# Patient Record
Sex: Female | Born: 1985 | Race: Black or African American | Hispanic: No | Marital: Married | Smoking: Never smoker
Health system: Southern US, Community
[De-identification: ages and names within clinical notes are randomized; demographics above are authoritative.]

## PROBLEM LIST (undated history)

## (undated) DIAGNOSIS — D649 Anemia, unspecified: Secondary | ICD-10-CM

## (undated) DIAGNOSIS — L564 Polymorphous light eruption: Secondary | ICD-10-CM

## (undated) DIAGNOSIS — O99210 Obesity complicating pregnancy, unspecified trimester: Secondary | ICD-10-CM

## (undated) DIAGNOSIS — Z789 Other specified health status: Secondary | ICD-10-CM

## (undated) HISTORY — PX: NO PAST SURGERIES: SHX2092

## (undated) HISTORY — PX: OTHER SURGICAL HISTORY: SHX169

---

## 2011-04-03 ENCOUNTER — Encounter: Payer: Self-pay | Admitting: Emergency Medicine

## 2011-04-03 ENCOUNTER — Emergency Department (HOSPITAL_COMMUNITY)
Admission: EM | Admit: 2011-04-03 | Discharge: 2011-04-03 | Disposition: A | Discharge status: Home / Self Care | Payer: BC Managed Care – PPO | Attending: Emergency Medicine | Admitting: Emergency Medicine

## 2011-04-03 DIAGNOSIS — L02211 Cutaneous abscess of abdominal wall: Secondary | ICD-10-CM

## 2011-04-03 DIAGNOSIS — L02219 Cutaneous abscess of trunk, unspecified: Secondary | ICD-10-CM | POA: Insufficient documentation

## 2011-04-03 MED ORDER — DOXYCYCLINE HYCLATE 100 MG PO TABS
100.0000 mg | ORAL_TABLET | Freq: Once | ORAL | Status: AC
  Administered 2011-04-03: 100 mg via ORAL
  Filled 2011-04-03: qty 1

## 2011-04-03 MED ORDER — DOXYCYCLINE HYCLATE 100 MG PO CAPS: 100.0000 mg | ORAL_CAPSULE | Freq: Two times a day (BID) | ORAL | Status: AC

## 2011-04-03 NOTE — ED Notes (Signed)
Has an abcess on her abd that started 4 or 5 days ago. Started draining 2 days ago

## 2011-04-03 NOTE — Discharge Instructions (Signed)

## 2011-04-03 NOTE — ED Provider Notes (Signed)
History     CSN: [id number] Arrival date & time: 04/03/2011  8:36 PM   First MD Initiated Contact with Patient 04/03/11 2104      Chief Complaint  Patient presents with  . Recurrent Skin Infections    Abcess started x 4 to 5 days ago    (Consider location/radiation/quality/duration/timing/severity/associated sxs/prior treatment) Patient is a 25 y.o. female presenting with abscess. The history is provided by the patient.  Abscess  This is a recurrent problem. The current episode started less than one week ago. The onset was gradual. The problem occurs frequently. The problem has been gradually worsening. The abscess is present on the abdomen. The problem is moderate. The abscess is characterized by redness, painfulness, burning and draining. It is unknown (had a friend with MRSA) what she was exposed to. The abscess first occurred at home. Pertinent negatives include no fever, no vomiting and no cough. There were no sick contacts. Recently, medical care has been given at this facility.    History reviewed. No pertinent past medical history.  History reviewed. No pertinent past surgical history.  No family history on file.  History  Substance Use Topics  . Smoking status: Never Smoker   . Smokeless tobacco: Never Used  . Alcohol Use: Yes    OB History    Grav Para Term Preterm Abortions TAB SAB Ect Mult Living                  Review of Systems  Constitutional: Negative for fever.  Respiratory: Negative for cough.   Gastrointestinal: Negative for vomiting.  All other systems reviewed and are negative.    Allergies  Review of patient's allergies indicates no known allergies.  Home Medications   Current Outpatient Rx  Name Route Sig Dispense Refill  . CHLORHEXIDINE GLUCONATE 4 % EX LIQD Topical Apply 60 mLs topically daily.      Marland Kitchen MUPIROCIN CALCIUM 2 % EX CREA Topical Apply 1 application topically 3 (three) times daily. APPLIES TO NOSTRILS     . TRAMADOL HCL 50 MG  PO TABS Oral Take 50-100 mg by mouth every 8 (eight) hours as needed. PAIN.Maximum dose= 8 tablets per day       BP 124/63  Pulse 95  Temp(Src) 99.3 F (37.4 C) (Oral)  Resp 20  SpO2 100%  LMP 03/31/2011  Physical Exam  Nursing note and vitals reviewed. Constitutional: She is oriented to person, place, and time. She appears well-developed and well-nourished. No distress.  HENT:  Head: Normocephalic and atraumatic.  Eyes: Conjunctivae and EOM are normal. Pupils are equal, round, and reactive to light.  Neck: Normal range of motion. Neck supple.  Abdominal: Soft. Bowel sounds are normal.    Musculoskeletal: Normal range of motion. She exhibits no tenderness.  Neurological: She is alert and oriented to person, place, and time.  Skin: Skin is warm and dry.  Psychiatric: She has a normal mood and affect. Her behavior is normal.    ED Course  Procedures (including critical care time)  Labs Reviewed - No data to display No results found.  INCISION AND DRAINAGE Performed by: Gwyneth Sprout Consent: Verbal consent obtained. Risks and benefits: risks, benefits and alternatives were discussed Type: abscess  Body area: abdomen  Anesthesia: local infiltration  Local anesthetic: lidocaine 1% with epinephrine  Anesthetic total: 8 ml  Complexity: complex Blunt dissection to break up loculations  Drainage: purulent  Drainage amount: 15mL  Packing material: 1/4 in iodoform gauze  Patient tolerance: Patient tolerated  the procedure well with no immediate complications.     No diagnosis found.    MDM   Pt with abscess on the abd which is recurrent.  This is now her third abscess.  Mild surrounding erythema.  Will I&D and start on doxy.  Will send a culture for further eval.  Pt is going to f/u with ID.       Gwyneth Sprout, MD 04/03/11 2151

## 2011-04-05 ENCOUNTER — Encounter (HOSPITAL_COMMUNITY): Payer: Self-pay | Admitting: *Deleted

## 2011-04-05 ENCOUNTER — Emergency Department (HOSPITAL_COMMUNITY)
Admission: EM | Admit: 2011-04-05 | Discharge: 2011-04-05 | Disposition: A | Discharge status: Home / Self Care | Payer: BC Managed Care – PPO | Attending: Emergency Medicine | Admitting: Emergency Medicine

## 2011-04-05 DIAGNOSIS — L0291 Cutaneous abscess, unspecified: Secondary | ICD-10-CM | POA: Insufficient documentation

## 2011-04-05 DIAGNOSIS — Z09 Encounter for follow-up examination after completed treatment for conditions other than malignant neoplasm: Secondary | ICD-10-CM | POA: Insufficient documentation

## 2011-04-05 NOTE — ED Provider Notes (Signed)
Medical screening examination/treatment/procedure(s) were performed by non-physician practitioner and as supervising physician I was immediately available for consultation/collaboration.  Nicholes Stairs, MD 04/05/11 [phone]

## 2011-04-05 NOTE — ED Notes (Signed)
Patient was seen in the ER on Friday.  Pt has history of MRSA x1 month ago. Pt has had abscesses to leg and arm. Pt noted abscess to right mid abdomen. Pt was given an antibiotic and had wound drained and packed. Pt was seen by MD Plunkett. Pt is here for a wound check and packing removal.  Pt reports some pain and reports yellow drainage and blood from wound. Pt denies fevers or red streaking from the wound.

## 2011-04-05 NOTE — ED Provider Notes (Signed)
History     CSN: [id number] Arrival date & time: 04/05/2011  1:11 PM   First MD Initiated Contact with Patient 04/05/11 1551      Chief Complaint  Patient presents with  . Wound Check    and packing removal      Patient is a 25 y.o. female presenting with wound check. The history is provided by the patient.  Wound Check  She was treated in the ED 2 to 3 days ago. Previous treatment in the ED includes I&D of abscess. Treatments since wound repair include oral antibiotics and soaks. There has been colored discharge from the wound. The redness has improved. There is no swelling present. The pain has not changed.  Pt had I&D to abscess to (R) lower abd 04/03/2011. Here for wound recheck.   History reviewed. No pertinent past medical history.  History reviewed. No pertinent past surgical history.  History reviewed. No pertinent family history.  History  Substance Use Topics  . Smoking status: Never Smoker   . Smokeless tobacco: Never Used  . Alcohol Use: Yes    OB History    Grav Para Term Preterm Abortions TAB SAB Ect Mult Living                  Review of Systems  Allergies  Review of patient's allergies indicates no known allergies.  Home Medications   Current Outpatient Rx  Name Route Sig Dispense Refill  . CHLORHEXIDINE GLUCONATE 4 % EX LIQD Topical Apply 60 mLs topically daily.      Marland Kitchen DOXYCYCLINE HYCLATE 100 MG PO CAPS Oral Take 1 capsule (100 mg total) by mouth 2 (two) times daily. 20 capsule 0  . MUPIROCIN CALCIUM 2 % EX CREA Topical Apply 1 application topically 3 (three) times daily. APPLIES TO NOSTRILS     . TRAMADOL HCL 50 MG PO TABS Oral Take 50-100 mg by mouth every 8 (eight) hours as needed. PAIN.Maximum dose= 8 tablets per day       BP 118/67  Pulse 74  Temp(Src) 98.6 F (37 C) (Oral)  Resp 18  SpO2 100%  LMP 03/31/2011  Physical Exam  ED Course  Procedures Packing removed. Wound appears to be improving though pt c/o persistent TTP. Will  encourage continued warm soaks and completion of abx.  Labs Reviewed - No data to display No results found.   No diagnosis found.    MDM  Improving abscess to (R) lower abd.        Leanne Chang, NP 04/05/11 1630

## 2011-04-06 LAB — WOUND CULTURE

## 2011-04-07 ENCOUNTER — Encounter: Payer: Self-pay | Admitting: Internal Medicine

## 2011-04-07 ENCOUNTER — Encounter: Payer: Self-pay | Admitting: Licensed Clinical Social Worker

## 2011-04-07 ENCOUNTER — Ambulatory Visit (INDEPENDENT_AMBULATORY_CARE_PROVIDER_SITE_OTHER): Payer: BC Managed Care – PPO | Admitting: Internal Medicine

## 2011-04-07 VITALS — BP 118/84 | HR 79 | Temp 98.4°F | Ht 67.0 in | Wt 234.0 lb

## 2011-04-07 DIAGNOSIS — A4902 Methicillin resistant Staphylococcus aureus infection, unspecified site: Secondary | ICD-10-CM

## 2011-04-07 MED ORDER — SULFAMETHOXAZOLE-TRIMETHOPRIM 400-80 MG PO TABS: 1.0000 | ORAL_TABLET | Freq: Two times a day (BID) | ORAL | Status: AC

## 2011-04-07 MED ORDER — RIFAMPIN 300 MG PO CAPS: 300.0000 mg | ORAL_CAPSULE | Freq: Two times a day (BID) | ORAL | Status: AC

## 2011-04-07 MED ORDER — CHLORHEXIDINE GLUCONATE 4 % EX LIQD: 60.0000 mL | Freq: Every day | CUTANEOUS | Status: DC

## 2011-04-07 NOTE — ED Notes (Signed)
Patient f/u with ID.

## 2011-04-08 DIAGNOSIS — A4902 Methicillin resistant Staphylococcus aureus infection, unspecified site: Secondary | ICD-10-CM | POA: Insufficient documentation

## 2011-04-08 NOTE — Progress Notes (Signed)
INFECTIOUS DISEASES INITIAL VISIT  RFV: recurrent MRSA skin infections  Subjective:    Patient ID: Regina Terrell, female    DOB: 1985/12/22, 25 y.o.   MRN: [id number]  HPI 25yo Female with no significant past medical history reports having new onset of skin abscesses since end of October 2012. She first noted to have a right knee lesion measuring 2.5cm and R arm lesion 1.5cm; she underwent I X D of knee lesion by PCP with cultures identifying MRSA ( vanco 1, tmp/smx <0.5, tetracycline <4, clinda S, erythro R). She received a course of bactrim which improved her lesion. The lesion on her R arm self draining. She now presents with a 7 day history of a lesion appearing on the right side of abdomen. It became increasingly tender and indurated, and thus she went to ED 4 days ago to be evaluated. She underwent her 2nd I X D and the incision packed for 2 days. She was given a 10 day course of doxycycline for infection and tramadol for pain. She is on day #4 of abtx. She states it is still somewhat tender, and her lesion is still draining fluid but less than before.  She denies fever, chills, nightsweats, no other skin lesions.  Current Outpatient Prescriptions  Medication Sig Dispense Refill  . doxycycline (VIBRAMYCIN) 100 MG capsule Take 1 capsule (100 mg total) by mouth 2 (two) times daily.  20 capsule  0  . mupirocin cream (BACTROBAN) 2 % Apply 1 application topically 3 (three) times daily. APPLIES TO NOSTRILS       . traMADol (ULTRAM) 50 MG tablet Take 50-100 mg by mouth every 8 (eight) hours as needed. PAIN.Maximum dose= 8 tablets per day       . chlorhexidine (HIBICLENS) 4 % external liquid Apply 60 mLs (4 application total) topically daily. Wash daily , head to toe, x 14 days  473 mL  1  . rifampin (RIFADIN) 300 MG capsule Take 1 capsule (300 mg total) by mouth 2 (two) times daily.  28 capsule  0  . sulfamethoxazole-trimethoprim (BACTRIM) 400-80 MG per tablet Take 1 tablet by mouth 2 (two)  times daily.  28 tablet  0   No Known Allergies  Past med hx: MRSA skin infections  SHx: patient lives in [place], goes to [employer], she works part-time for [employer]. No smoking. No drinking. No illicit drugs. Has a small dog that now resides with her mother.  FHX: HTN, DM  Review of Systems Constitutional: Negative for fever, chills, diaphoresis, activity change, appetite change, fatigue and unexpected weight change.  HENT: Negative for congestion, sore throat, rhinorrhea, sneezing, trouble swallowing and sinus pressure.  Eyes: Negative for photophobia and visual disturbance.  Respiratory: Negative for cough, chest tightness, shortness of breath, wheezing and stridor.  Cardiovascular: Negative for chest pain, palpitations and leg swelling.  Gastrointestinal: Negative for nausea, vomiting, abdominal pain, diarrhea, constipation, blood in stool, abdominal distention and anal bleeding.  Genitourinary: Negative for dysuria, hematuria, flank pain and difficulty urinating.  Musculoskeletal: Negative for myalgias, back pain, joint swelling, arthralgias and gait problem.  Skin: wound on her abdomen from recent I X D, painful and still weeping.  Neurological: Negative for dizziness, tremors, weakness and light-headedness.  Hematological: Negative for adenopathy. Does not bruise/bleed easily.  Psychiatric/Behavioral: Negative for behavioral problems, confusion, sleep disturbance, dysphoric mood, decreased concentration and agitation.       Objective:   Physical Exam BP 118/84  Pulse 79  Temp(Src) 98.4 F (36.9 C) (Oral)  Ht 5\' 7"  (1.702 m)  Wt 234 lb (106.142 kg)  BMI 36.65 kg/m2  LMP 03/31/2011  General Appearance:    Alert, cooperative, no distress, appears stated age  Head:    Normocephalic, without obvious abnormality, atraumatic  Eyes:    PERRL, conjunctiva/corneas clear, EOM's intact, fundi    benign, both eyes  Ears:    Normal TM's and external ear canals, both ears    Nose:   Nares normal, septum midline, mucosa normal, no drainage    or sinus tenderness  Throat:   Lips, mucosa, and tongue normal; teeth and gums normal  Neck:   Supple, symmetrical, trachea midline, no adenopathy;    thyroid:  no enlargement/tenderness/nodules; no carotid   bruit or JVD     Lungs:     Clear to auscultation bilaterally, respirations unlabored      Heart:    Regular rate and rhythm, S1 and S2 normal, no murmur, rub   or gallop     Abdomen:     Soft, non-tender, bowel sounds active all four quadrants,    no masses, no organomegaly        Extremities:   Extremities normal, atraumatic, no cyanosis or edema  Pulses:   2+ and symmetric all extremities  Skin:   Small, 1.5cm incision in between RUQ/RLQ from I X D of skin abscess 2-3 mm deep, still has purulent exudate. Indurated 4 cm. Not warm, no fluctuance  Lymph nodes:   Cervical, supraclavicular, and axillary nodes normal      Old micro: 03/02/11: right knee cx. Rare GPC and WBC. + MRSA(  vanco 1, tmp/smx <0.5, tetracycline <4, clinda S, erythro R)    Assessment & Plan:  MRSA Skin infection = continue with doxycycline until she finishes out 10 day course of therapy. Will clean and pack with wick at today's clinic, and have the patient come back in 2 days to see how it is healing.  MRSA decolonization = after she finished doxycycline, will do  2wks of bactrim plus rifampin, hibiclens body wash, and mupirocin nasal swab TID.

## 2011-04-09 ENCOUNTER — Ambulatory Visit: Payer: BC Managed Care – PPO | Admitting: *Deleted

## 2011-04-09 DIAGNOSIS — A4902 Methicillin resistant Staphylococcus aureus infection, unspecified site: Secondary | ICD-10-CM

## 2011-04-13 ENCOUNTER — Ambulatory Visit: Payer: BC Managed Care – PPO

## 2011-04-14 ENCOUNTER — Encounter: Payer: Self-pay | Admitting: *Deleted

## 2011-04-14 ENCOUNTER — Ambulatory Visit (INDEPENDENT_AMBULATORY_CARE_PROVIDER_SITE_OTHER): Payer: BC Managed Care – PPO | Admitting: Internal Medicine

## 2011-04-14 ENCOUNTER — Encounter: Payer: Self-pay | Admitting: Internal Medicine

## 2011-04-14 VITALS — BP 122/80 | HR 72 | Temp 98.5°F | Wt 232.0 lb

## 2011-04-14 DIAGNOSIS — L0390 Cellulitis, unspecified: Secondary | ICD-10-CM

## 2011-04-14 DIAGNOSIS — L0291 Cutaneous abscess, unspecified: Secondary | ICD-10-CM

## 2011-04-14 NOTE — Progress Notes (Signed)
INFECTIOUS DISEASES FOLLOW-UP Subjective:    Patient ID: Regina Terrell, female    DOB: [birth year], 25 y.o.   MRN: [id number]  HPI  HPI 25yo Female with history of MRSA skin abscess since end of October 2012. Last seen in ID clinic on 12/07 for 1 wk hx of abdominal SSTI/abscess that became increasingly tender and indurated, and went to ED for 2nd I X D . She just finished a 10 day course of doxycycline for infection and tramadol for pain. She was started on MRSA decolonization protocol with bactrim, rifampin, mupiricin nasal ointment and chlorohexedine body wash. Her lesion is slowly improving. No longer draining. Less indurated, getting smaller in size.  She denies fever, chills, nightsweats, no other skin lesions.  Abtx: Bactrim DS 1 tab BID Rifampin 300mg  BID Mupirocin ointment TID chlorohexidine body wash daily   Review of Systems Review of Systems  Constitutional: Negative for fever, chills, diaphoresis, activity change, appetite change, fatigue and unexpected weight change.  HENT: Negative for congestion, sore throat, rhinorrhea, sneezing, trouble swallowing and sinus pressure.  Eyes: Negative for photophobia and visual disturbance.  Respiratory: Negative for cough, chest tightness, shortness of breath, wheezing and stridor.  Cardiovascular: Negative for chest pain, palpitations and leg swelling.  Gastrointestinal: Negative for nausea, vomiting, abdominal pain, diarrhea, constipation, blood in stool, abdominal distention and anal bleeding.  Genitourinary: Negative for dysuria, hematuria, flank pain and difficulty urinating.  Musculoskeletal: Negative for myalgias, back pain, joint swelling, arthralgias and gait problem.  Skin: slowly healing abdominal wound, mildly tender Neurological: Negative for dizziness, tremors, weakness and light-headedness.  Hematological: Negative for adenopathy. Does not bruise/bleed easily.  Psychiatric/Behavioral: Negative for behavioral  problems, confusion, sleep disturbance, dysphoric mood, decreased concentration and agitation.       Objective:   Physical Exam Skin exam: 9 x 3 cm hyperpigmented elliptical lesion with a small<37mm incision left of center. Indurated area measures 6 x 2cm  Labs:  12/7: abdominal wound MSSA     Assessment & Plan:  24yo F with known MRSA skin infections finished course of doxycycline for MSSA abscess s/p I X D.  - MSSA abdominal abscess s/p I X D = looking improved - MRSA decolonization = continue with current regimen of bactrim, rifampin, mupirocin, and chlorohexedine body wash - RTC in 2 wks to see how wound is doing.

## 2011-04-15 ENCOUNTER — Telehealth: Payer: Self-pay | Admitting: *Deleted

## 2011-04-15 ENCOUNTER — Ambulatory Visit: Payer: BC Managed Care – PPO | Admitting: Internal Medicine

## 2011-04-15 NOTE — Telephone Encounter (Signed)
Pt reports "fever, feeling hot" this AM.  Requesting not to return to work today.  Previously no reported "fever."  Pt given return appt w/ Dr. Drue Second for evaluation.

## 2011-04-16 ENCOUNTER — Ambulatory Visit (INDEPENDENT_AMBULATORY_CARE_PROVIDER_SITE_OTHER): Payer: BC Managed Care – PPO | Admitting: Internal Medicine

## 2011-04-16 ENCOUNTER — Encounter: Payer: Self-pay | Admitting: Internal Medicine

## 2011-04-16 VITALS — BP 125/78 | HR 76 | Temp 98.3°F | Wt 234.0 lb

## 2011-04-16 DIAGNOSIS — B349 Viral infection, unspecified: Secondary | ICD-10-CM

## 2011-04-16 DIAGNOSIS — B9789 Other viral agents as the cause of diseases classified elsewhere: Secondary | ICD-10-CM

## 2011-04-16 LAB — CBC WITH DIFFERENTIAL/PLATELET
Hemoglobin: 12.3 g/dL (ref 12.0–15.0)
Lymphs Abs: 1.4 10*3/uL (ref 0.7–4.0)
Monocytes Relative: 9 % (ref 3–12)
Neutro Abs: 1.3 10*3/uL — ABNORMAL LOW (ref 1.7–7.7)
Neutrophils Relative %: 41 % — ABNORMAL LOW (ref 43–77)
Platelets: 243 10*3/uL (ref 150–400)
RBC: 4.13 MIL/uL (ref 3.87–5.11)
WBC: 3.3 10*3/uL — ABNORMAL LOW (ref 4.0–10.5)

## 2011-04-16 MED ORDER — PROMETHAZINE HCL 12.5 MG PO TABS: 12.5000 mg | ORAL_TABLET | Freq: Four times a day (QID) | ORAL | Status: AC | PRN

## 2011-05-03 NOTE — Progress Notes (Signed)
INFECTIOUS DISEASES FOLLOW-UP VISIT  RFV: fever x 24-36hr  Subjective:    Patient ID: Regina Terrell, female    DOB: [birth year], 26 y.o.   MRN: [id number]  HPI Regina Terrell has been the ID clinic for management of MRSA/MSSA abscesses. She underwent I X D by the ED on , finished a 14 day course of doxycycline. Now, she is in the 1st week of MRSA  decolonization protocol with bactrim, rifampin, chlorohexidine baths, and mupirocin nasal swabs. She states that her abdominal wound continues to heal. No longer draining. enduration is decreasing in size. Over the last 2 days, she has had malaise, fevers, and myalgias. She denies respiratory symptoms, of runny nose, congestion, cough, shortness of breath. She gets periodic relief with tylenol. She denies any sick contacts. She denies any diarrhea, nausea, vomiting, but has decreased appetite, no dysuria  Current outpatient prescriptions:chlorhexidine (HIBICLENS) 4 % external liquid, Apply 60 mLs (4 application total) topically daily. Wash daily , head to toe, x 14 days, Disp: 473 mL, Rfl: 1;  mupirocin cream (BACTROBAN) 2 %, Apply 1 application topically 3 (three) times daily. APPLIES TO NOSTRILS , Disp: , Rfl:  traMADol (ULTRAM) 50 MG tablet, Take 50-100 mg by mouth every 8 (eight) hours as needed. PAIN.Maximum dose= 8 tablets per day , Disp: , Rfl:   No Known Allergies  Active Ambulatory Problems    Diagnosis Date Noted  . MRSA infection 04/08/2011   Resolved Ambulatory Problems    Diagnosis Date Noted  . 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  Review of Systems  Constitutional: positive for fever, chills, diaphoresis, activity change, appetite change, fatigue . HENT: Negative for congestion, sore throat, rhinorrhea, sneezing, trouble swallowing and sinus pressure.  Eyes: Negative for photophobia and visual disturbance.    Respiratory: Negative for cough, chest tightness, shortness of breath, wheezing and stridor.  Cardiovascular: Negative for chest pain, palpitations and leg swelling.  Gastrointestinal: Negative for nausea, vomiting, abdominal pain, diarrhea, constipation, blood in stool, abdominal distention and anal bleeding.  Genitourinary: Negative for dysuria, hematuria, flank pain and difficulty urinating.  Musculoskeletal: Negative for myalgias, back pain, joint swelling, arthralgias and gait problem.  Skin: Negative for color change, pallor. rash and wound on abdomen is still present, closing but still has < 1mm opening from i x d; enduration size is improving Neurological: Negative for dizziness, tremors, weakness and light-headedness.  Hematological: Negative for adenopathy. Does not bruise/bleed easily.  Psychiatric/Behavioral: Negative for behavioral problems, confusion, sleep disturbance, dysphoric mood, decreased concentration and agitation.       Objective:   Physical Exam BP 125/78  Pulse 76  Temp(Src) 98.3 F (36.8 C) (Oral)  Wt 234 lb (106.142 kg)  LMP 03/31/2011  General Appearance:    Alert, cooperative, ill appearing, appears stated age  Head:    Normocephalic, without obvious abnormality, atraumatic  Eyes:    PERRL, mild conjunctivis bilaterally, EOM's intact,   Ears:    Normal TM's and external ear canals, both ears  Nose:   Nares normal, septum midline, mucosa normal, no drainage    or sinus tenderness  Throat:   Lips, mucosa, and tongue normal; teeth and gums normal  Neck:   Supple, symmetrical, trachea midline, no adenopathy;    thyroid:  no enlargement/tenderness/nodules; no carotid   bruit or JVD  Back:     Symmetric, no curvature, ROM normal, no  CVA tenderness  Lungs:     Clear to auscultation bilaterally, respirations unlabored      Heart:    Regular rate and rhythm, S1 and S2 normal, no murmur, rub   or gallop     Abdomen:     Soft, non-tender, bowel sounds active  all four quadrants,    no masses, no organomegaly        Extremities:   Extremities normal, atraumatic, no cyanosis or edema  Pulses:   2+ and symmetric all extremities  Skin:   Skin color, texture, turgor normal, abdominal wound is improving per HPI description.  Lymph nodes:   Cervical, supraclavicular, and axillary nodes normal  Neurologic:   CNII-XII intact, normal strength, sensation and reflexes    throughout         Assessment & Plan:  Probable viral illness = continue with taking tylenol as ant-pyretic. Continue to hydrate.if fever persists, to return to clinic for further work-up. If this is early ILI 2/2 flu, patient does not have risk factors for which oseltamivr would be warranted. Will give work-medical letter so that she has a few days off in order to recover from her present illness. May return to work on the 24th.  MSSA abscess s/p I X D finished 10 day course of doxycycline= initially her abscess was thought to be recurrent MRSA, given doxycycline by ED. Wound cultures from clinic revealed MSSA. Overall improved.  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

## 2015-02-09 ENCOUNTER — Emergency Department (HOSPITAL_COMMUNITY)
Admission: EM | Admit: 2015-02-09 | Discharge: 2015-02-09 | Disposition: A | Discharge status: Home / Self Care | Payer: BLUE CROSS/BLUE SHIELD | Attending: Emergency Medicine | Admitting: Emergency Medicine

## 2015-02-09 ENCOUNTER — Emergency Department (HOSPITAL_COMMUNITY): Payer: BLUE CROSS/BLUE SHIELD

## 2015-02-09 ENCOUNTER — Encounter (HOSPITAL_COMMUNITY): Payer: Self-pay | Admitting: Emergency Medicine

## 2015-02-09 DIAGNOSIS — Z872 Personal history of diseases of the skin and subcutaneous tissue: Secondary | ICD-10-CM | POA: Diagnosis not present

## 2015-02-09 DIAGNOSIS — R0602 Shortness of breath: Secondary | ICD-10-CM | POA: Diagnosis not present

## 2015-02-09 DIAGNOSIS — R0600 Dyspnea, unspecified: Secondary | ICD-10-CM | POA: Insufficient documentation

## 2015-02-09 DIAGNOSIS — R002 Palpitations: Secondary | ICD-10-CM | POA: Diagnosis not present

## 2015-02-09 DIAGNOSIS — R0789 Other chest pain: Secondary | ICD-10-CM | POA: Insufficient documentation

## 2015-02-09 HISTORY — DX: Polymorphous light eruption: L56.4

## 2015-02-09 MED ORDER — ALBUTEROL SULFATE HFA 108 (90 BASE) MCG/ACT IN AERS
2.0000 | INHALATION_SPRAY | Freq: Once | RESPIRATORY_TRACT | Status: AC
  Administered 2015-02-09: 2 via RESPIRATORY_TRACT
  Filled 2015-02-09: qty 6.7

## 2015-02-09 NOTE — ED Notes (Signed)
Patient states 5 days ago she began having cold symptoms. Patient states prior to that she began having the sensation she was unable to get a good breath in. Patient speaking in complete sentences, denies pain. SPO2 100% on RA.

## 2015-02-09 NOTE — ED Notes (Addendum)
Pt reports that she sometimes having a hard time taking breaths and that her heart feels weird when she sleeps. She reports that she has a hard time sleeping sometimes. She says"I'm  Not really in pain". Pt has clear lung sounds bilaterally.

## 2015-02-09 NOTE — Discharge Instructions (Signed)
USE THE INHALER EVERY 4 HOURS IF IT PROVIDES ANY RELIEF. RETURN TO THE EMERGENCY DEPARTMENT IF YOU HAVE ANY WORSENING SYMPTOMS OR NEW CONCERNS.

## 2015-02-09 NOTE — ED Notes (Deleted)
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

## 2015-02-09 NOTE — ED Notes (Signed)
Pt alert,oriented,and ambulatory upon DC. She was advised to follow up with PCP and use provided inhaler q 4 hrs if helpful.
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
## 2015-03-15 ENCOUNTER — Encounter: Payer: Self-pay | Admitting: Internal Medicine

## 2015-03-15 ENCOUNTER — Ambulatory Visit (INDEPENDENT_AMBULATORY_CARE_PROVIDER_SITE_OTHER): Payer: BLUE CROSS/BLUE SHIELD | Admitting: Internal Medicine

## 2015-03-15 VITALS — BP 110/68 | HR 68 | Ht 66.0 in | Wt 171.0 lb

## 2015-03-15 DIAGNOSIS — R0600 Dyspnea, unspecified: Secondary | ICD-10-CM | POA: Diagnosis not present

## 2015-03-15 NOTE — Progress Notes (Signed)
   Subjective:    Patient ID: Regina Terrell, female    DOB: 06-20-1985,    MRN: [id number]  HPI  [age] yobf never smoker eczema as child worse with sun exp eval in [place] but never problem cough/ wheezing and new onset of sob typically at rest sitting at desk new job x in Sept 2016 > [facility] with saba no better > referred to pulmonary clinic 03/15/2015 by Farris HasAaron Morrow   03/15/2015 1st [facility] office visit/ Regina Terrell   Chief Complaint  Patient presents with  . Pulmonary Consult    Referred by Dr. Farris HasAaron Morrow. Pt c/o palpitations and "trouble exhaling" for the past 2 months.   always occurs sitting never occurs with exertions or sleeping but when try to fall asleeep has sensation can't deep breath no cough   No obvious other patterns in day to day or daytime variabilty or assoc chronic cough or cp or chest tightness, subjective wheeze overt sinus or hb symptoms. No unusual exp hx or h/o childhood pna/ asthma or knowledge of premature birth.  Sleeping ok without nocturnal  or early am exacerbation  of respiratory  c/o's or need for noct saba. Also denies any obvious fluctuation of symptoms with weather or environmental changes or other aggravating or alleviating factors except as outlined above   Current Medications, Allergies, Complete Past Medical History, Past Surgical History, Family History, and Social History were reviewed in [facility] electronic medical record.           Review of Systems  Constitutional: Negative for fever, chills and unexpected weight change.  HENT: Negative for congestion, dental problem, ear pain, nosebleeds, postnasal drip, rhinorrhea, sinus pressure, sneezing, sore throat, trouble swallowing and voice change.   Eyes: Negative for visual disturbance.  Respiratory: Positive for shortness of breath. Negative for cough and choking.   Cardiovascular: Positive for palpitations. Negative for chest pain and leg swelling.    Gastrointestinal: Negative for vomiting, abdominal pain and diarrhea.  Genitourinary: Negative for difficulty urinating.  Musculoskeletal: Negative for arthralgias.  Skin: Negative for rash.  Neurological: Negative for tremors, syncope and headaches.  Hematological: Does not bruise/bleed easily.       Objective:   Physical Exam  amb thin bf nad  Wt Readings from Last 3 Encounters:  03/15/15 171 lb (77.565 kg)  02/09/15 168 lb (76.204 kg)  04/16/11 234 lb (106.142 kg)    Vital signs reviewed   HEENT: nl dentition, turbinates, and oropharynx. Nl external ear canals without cough reflex   NECK :  without JVD/Nodes/TM/ nl carotid upstrokes bilaterally   LUNGS: no acc muscle use, clear to A and P bilaterally without cough on insp or exp maneuvers   CV:  RRR  no s3 or murmur or increase in P2, no edema  ? Mid systolic click   ABD:  soft and nontender with nl excursion in the supine position. No bruits or organomegaly, bowel sounds nl  MS:  warm without deformities, calf tenderness, cyanosis or clubbing  SKIN: warm and dry without lesions    NEURO:  alert, approp, no deficits     I personally reviewed images and agree with radiology impression as follows:  CXR:  02/09/15 No active cardiopulmonary disease.    Cbc 03/07/15  Eos 0.1         Assessment & Plan:

## 2015-03-15 NOTE — Patient Instructions (Signed)
Please see patient coordinator before you leave today  to schedule echocardiogram and we will call you with report   To get the most out of exercise, you need to be continuously aware that you are short of breath, but never out of breath, for 30 minutes daily. As you improve, it will actually be easier for you to do the same amount of exercise  in  30 minutes so always push to the level where you are short of breath   If not better after two weeks of doing the above exercise, call libby at [phone] to schedule cpst with spirometry before and after to be sure this is not asthma

## 2015-03-15 NOTE — Assessment & Plan Note (Addendum)
Spirometry  03/15/2015 wnl including [id number]   Symptoms are markedly disproportionate to objective findings and not clear this is a lung problem but pt does appear to have difficult airway management issues. DDX of  difficult airways management all start with A and  include Adherence, Ace Inhibitors, Acid Reflux, Active Sinus Disease, Alpha 1 Antitripsin deficiency, Anxiety masquerading as Airways dz,  ABPA,  allergy(esp in young), Aspiration (esp in elderly), Adverse effects of meds,  Active smokers, A bunch of PE's (a small clot burden can't cause this syndrome unless there is already severe underlying pulm or vascular dz with poor reserve) plus two Bs  = Bronchiectasis and Beta blocker use..and one C= CHF  ? Allergy/asthma > neg response to saba/no worse with ex/sleep all are  against though note has h/o exzema hx   ? Anxiety / panic disorder > try regular exercise/ reassurance   ? Cardiac ? MVP > check echo   Encouraged by stated exercise tolerance > rec more aerobics and if anything reproducible with ex needs cpst with spirometry before and after to complete the w/u   I had an extended discussion with the patient reviewing all relevant studies completed to date and  lasting  35 minutes of a 60 minute visit    Each maintenance medication was reviewed in detail including most importantly the difference between maintenance and prns and under what circumstances the prns are to be triggered using an action plan format that is not reflected in the computer generated alphabetically organized AVS.    Please see instructions for details which were reviewed in writing and the patient given a copy highlighting the part that I personally wrote and discussed at today's ov.

## 2015-03-27 ENCOUNTER — Encounter (HOSPITAL_COMMUNITY): Payer: Self-pay | Admitting: *Deleted

## 2015-03-27 ENCOUNTER — Ambulatory Visit (HOSPITAL_COMMUNITY): Payer: BLUE CROSS/BLUE SHIELD | Attending: Cardiovascular Disease

## 2015-03-27 ENCOUNTER — Telehealth: Payer: Self-pay | Admitting: *Deleted

## 2015-03-27 ENCOUNTER — Other Ambulatory Visit: Payer: Self-pay

## 2015-03-27 DIAGNOSIS — R011 Cardiac murmur, unspecified: Secondary | ICD-10-CM

## 2015-03-27 DIAGNOSIS — I340 Nonrheumatic mitral (valve) insufficiency: Secondary | ICD-10-CM | POA: Insufficient documentation

## 2015-03-27 DIAGNOSIS — I517 Cardiomegaly: Secondary | ICD-10-CM | POA: Diagnosis not present

## 2015-03-27 DIAGNOSIS — R0600 Dyspnea, unspecified: Secondary | ICD-10-CM | POA: Insufficient documentation

## 2015-03-27 DIAGNOSIS — R0602 Shortness of breath: Secondary | ICD-10-CM | POA: Insufficient documentation

## 2015-03-27 NOTE — Telephone Encounter (Signed)
Patient requesting referral to a Cardiologist.

## 2015-03-27 NOTE — Telephone Encounter (Signed)
Yes, sorry>>  that's what  I meant with set up with cardiology = refer to cards officially   thx

## 2015-03-27 NOTE — Telephone Encounter (Signed)
-----   Message from Nyoka CowdenMichael B Wert, MD sent at 03/27/2015 11:02 AM EST ----- Call patient :  Study is c/w a mild heart murmur which I suspected on exam but probably not of major significance for now except need to let her dentist know for any procedures and needs set up f/u longterm with cards to keep an eye on it over time

## 2015-03-27 NOTE — Telephone Encounter (Signed)
I have placed referral. LMTCB x1 for pt

## 2015-03-28 NOTE — Telephone Encounter (Signed)
Called and spoke with patient. Informed her of the cardiology referral. She voiced understanding and had no further questions. Nothing further needed. Will sign off on message.
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
## 2015-04-02 ENCOUNTER — Encounter: Payer: Self-pay | Admitting: Cardiology

## 2015-04-02 ENCOUNTER — Ambulatory Visit (INDEPENDENT_AMBULATORY_CARE_PROVIDER_SITE_OTHER): Payer: BLUE CROSS/BLUE SHIELD | Admitting: Cardiology

## 2015-04-02 VITALS — BP 110/60 | HR 71 | Ht 66.0 in | Wt 176.9 lb

## 2015-04-02 DIAGNOSIS — R011 Cardiac murmur, unspecified: Secondary | ICD-10-CM

## 2015-04-02 DIAGNOSIS — R002 Palpitations: Secondary | ICD-10-CM

## 2015-04-02 DIAGNOSIS — R0600 Dyspnea, unspecified: Secondary | ICD-10-CM

## 2015-04-02 LAB — TSH: TSH: 2.331 u[IU]/mL (ref 0.350–4.500)

## 2015-04-02 NOTE — Assessment & Plan Note (Signed)
Patient has palpitations on a nightly basis. These are described as heart skipping. We will arrange a 24-hour Holter monitor to further assess. Question prominent thyroid on examination. Check TSH to exclude hyperthyroidism. Follow-up primary care.

## 2015-04-02 NOTE — Assessment & Plan Note (Signed)
I do not appreciate a murmur on examination. Echocardiogram shows mild mitral regurgitation. We will not pursue further evaluation.

## 2015-04-02 NOTE — Patient Instructions (Signed)
Your physician has recommended that you wear a 24 HOUR holter monitor. Holter monitors are medical devices that record the heart's electrical activity. Doctors most often use these monitors to diagnose arrhythmias. Arrhythmias are problems with the speed or rhythm of the heartbeat. The monitor is a small, portable device. You can wear one while you do your normal daily activities. This is usually used to diagnose what is causing palpitations/syncope (passing out).  Your physician recommends that you schedule a follow-up appointment in: AS NEEDED

## 2015-04-02 NOTE — Assessment & Plan Note (Signed)
Etiology unclear. Echocardiogram reviewed. LV function is normal and there is mild mitral regurgitation. I do not think this is contributing to her dyspnea. She is not volume overloaded on examination.

## 2015-04-04 ENCOUNTER — Ambulatory Visit (INDEPENDENT_AMBULATORY_CARE_PROVIDER_SITE_OTHER): Payer: BLUE CROSS/BLUE SHIELD

## 2015-04-04 DIAGNOSIS — R002 Palpitations: Secondary | ICD-10-CM

## 2015-04-10 ENCOUNTER — Telehealth: Payer: Self-pay | Admitting: Cardiology

## 2015-04-10 NOTE — Telephone Encounter (Signed)
New message ° ° ° ° ° °Calling to get monitor results °

## 2015-04-10 NOTE — Telephone Encounter (Signed)
Called patient about her monitor results as Dr. Jens Somrenshaw stated below. Patient verbalized  understanding and will follow up as needed.  Notes Recorded by Lewayne BuntingBrian S Crenshaw, MD on 04/10/2015 at 5:32 AM Hazle Cocak Brian Crenshaw

## 2015-04-12 ENCOUNTER — Ambulatory Visit: Payer: BLUE CROSS/BLUE SHIELD | Admitting: Cardiovascular Disease

## 2016-07-07 ENCOUNTER — Emergency Department (HOSPITAL_COMMUNITY)
Admission: EM | Admit: 2016-07-07 | Discharge: 2016-07-07 | Disposition: A | Discharge status: Home / Self Care | Payer: BLUE CROSS/BLUE SHIELD | Attending: Emergency Medicine | Admitting: Emergency Medicine

## 2016-07-07 DIAGNOSIS — Z79899 Other long term (current) drug therapy: Secondary | ICD-10-CM | POA: Diagnosis not present

## 2016-07-07 DIAGNOSIS — S3992XA Unspecified injury of lower back, initial encounter: Secondary | ICD-10-CM | POA: Diagnosis present

## 2016-07-07 DIAGNOSIS — S29019A Strain of muscle and tendon of unspecified wall of thorax, initial encounter: Secondary | ICD-10-CM

## 2016-07-07 DIAGNOSIS — Y939 Activity, unspecified: Secondary | ICD-10-CM | POA: Diagnosis not present

## 2016-07-07 DIAGNOSIS — S29012A Strain of muscle and tendon of back wall of thorax, initial encounter: Secondary | ICD-10-CM | POA: Insufficient documentation

## 2016-07-07 DIAGNOSIS — S39012A Strain of muscle, fascia and tendon of lower back, initial encounter: Secondary | ICD-10-CM | POA: Diagnosis not present

## 2016-07-07 DIAGNOSIS — Y92410 Unspecified street and highway as the place of occurrence of the external cause: Secondary | ICD-10-CM | POA: Insufficient documentation

## 2016-07-07 DIAGNOSIS — Y999 Unspecified external cause status: Secondary | ICD-10-CM | POA: Insufficient documentation

## 2016-07-07 MED ORDER — NAPROXEN 500 MG PO TABS: 500.0000 mg | ORAL_TABLET | Freq: Two times a day (BID) | ORAL | 0 refills | Status: DC

## 2016-07-07 MED ORDER — METHOCARBAMOL 500 MG PO TABS: 500.0000 mg | ORAL_TABLET | Freq: Two times a day (BID) | ORAL | 0 refills | Status: DC

## 2016-07-07 MED ORDER — IBUPROFEN 800 MG PO TABS
800.0000 mg | ORAL_TABLET | Freq: Once | ORAL | Status: AC
  Administered 2016-07-07: 800 mg via ORAL
  Filled 2016-07-07: qty 1

## 2016-07-07 MED ORDER — METHOCARBAMOL 500 MG PO TABS
500.0000 mg | ORAL_TABLET | Freq: Once | ORAL | Status: AC
  Administered 2016-07-07: 500 mg via ORAL
  Filled 2016-07-07: qty 1
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
## 2016-07-07 NOTE — Discharge Instructions (Signed)

## 2017-06-14 IMAGING — CR DG CHEST 2V
2 series · 2 of 2 positions shown · non-contrast
Comparison: None.

CLINICAL DATA: Patient feels like her heartbeats really fast when
she takes a deep breath for 5 days. Shortness of breath. Nonsmoker.

EXAM:
CHEST  2 VIEW

[w chest pa]
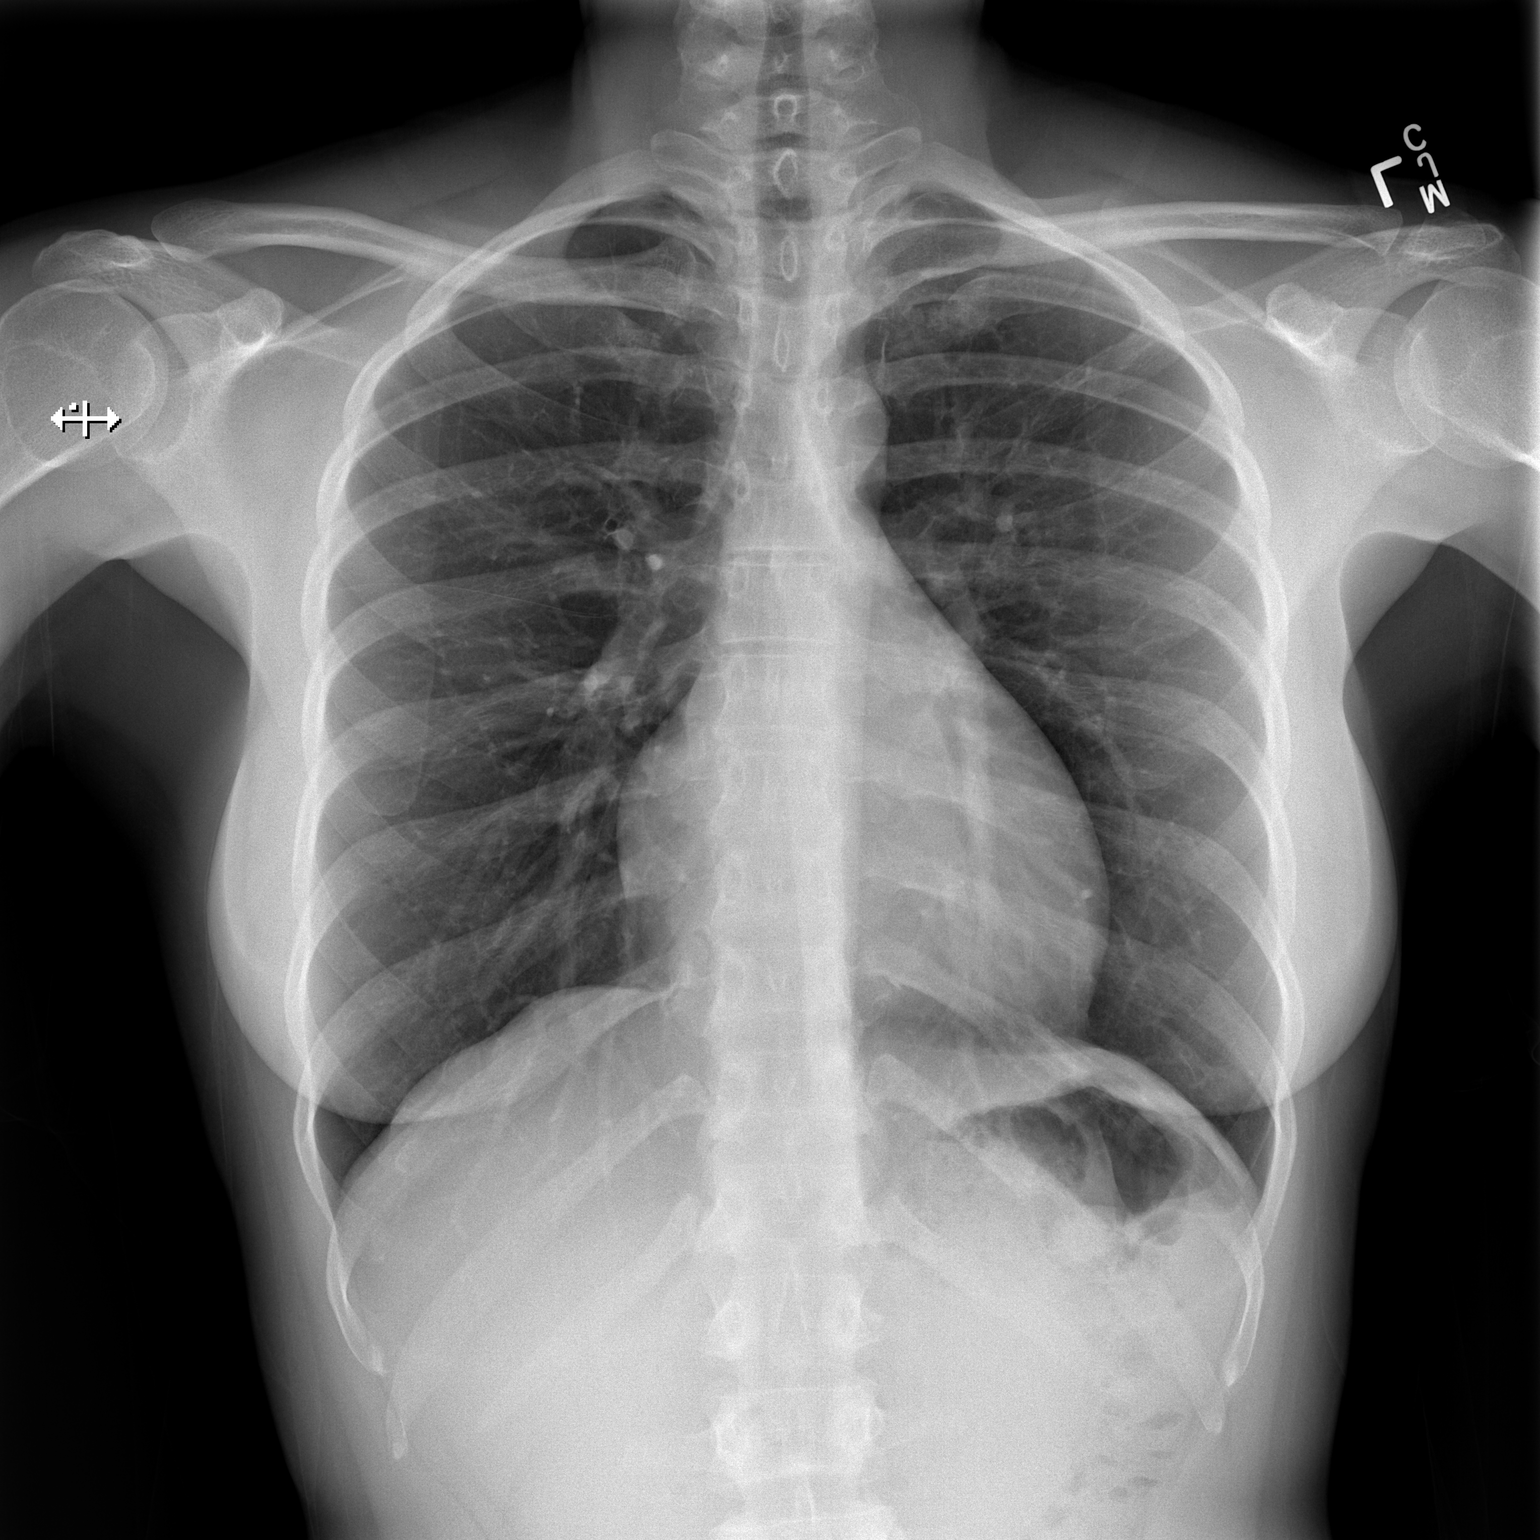

[w chest lat]
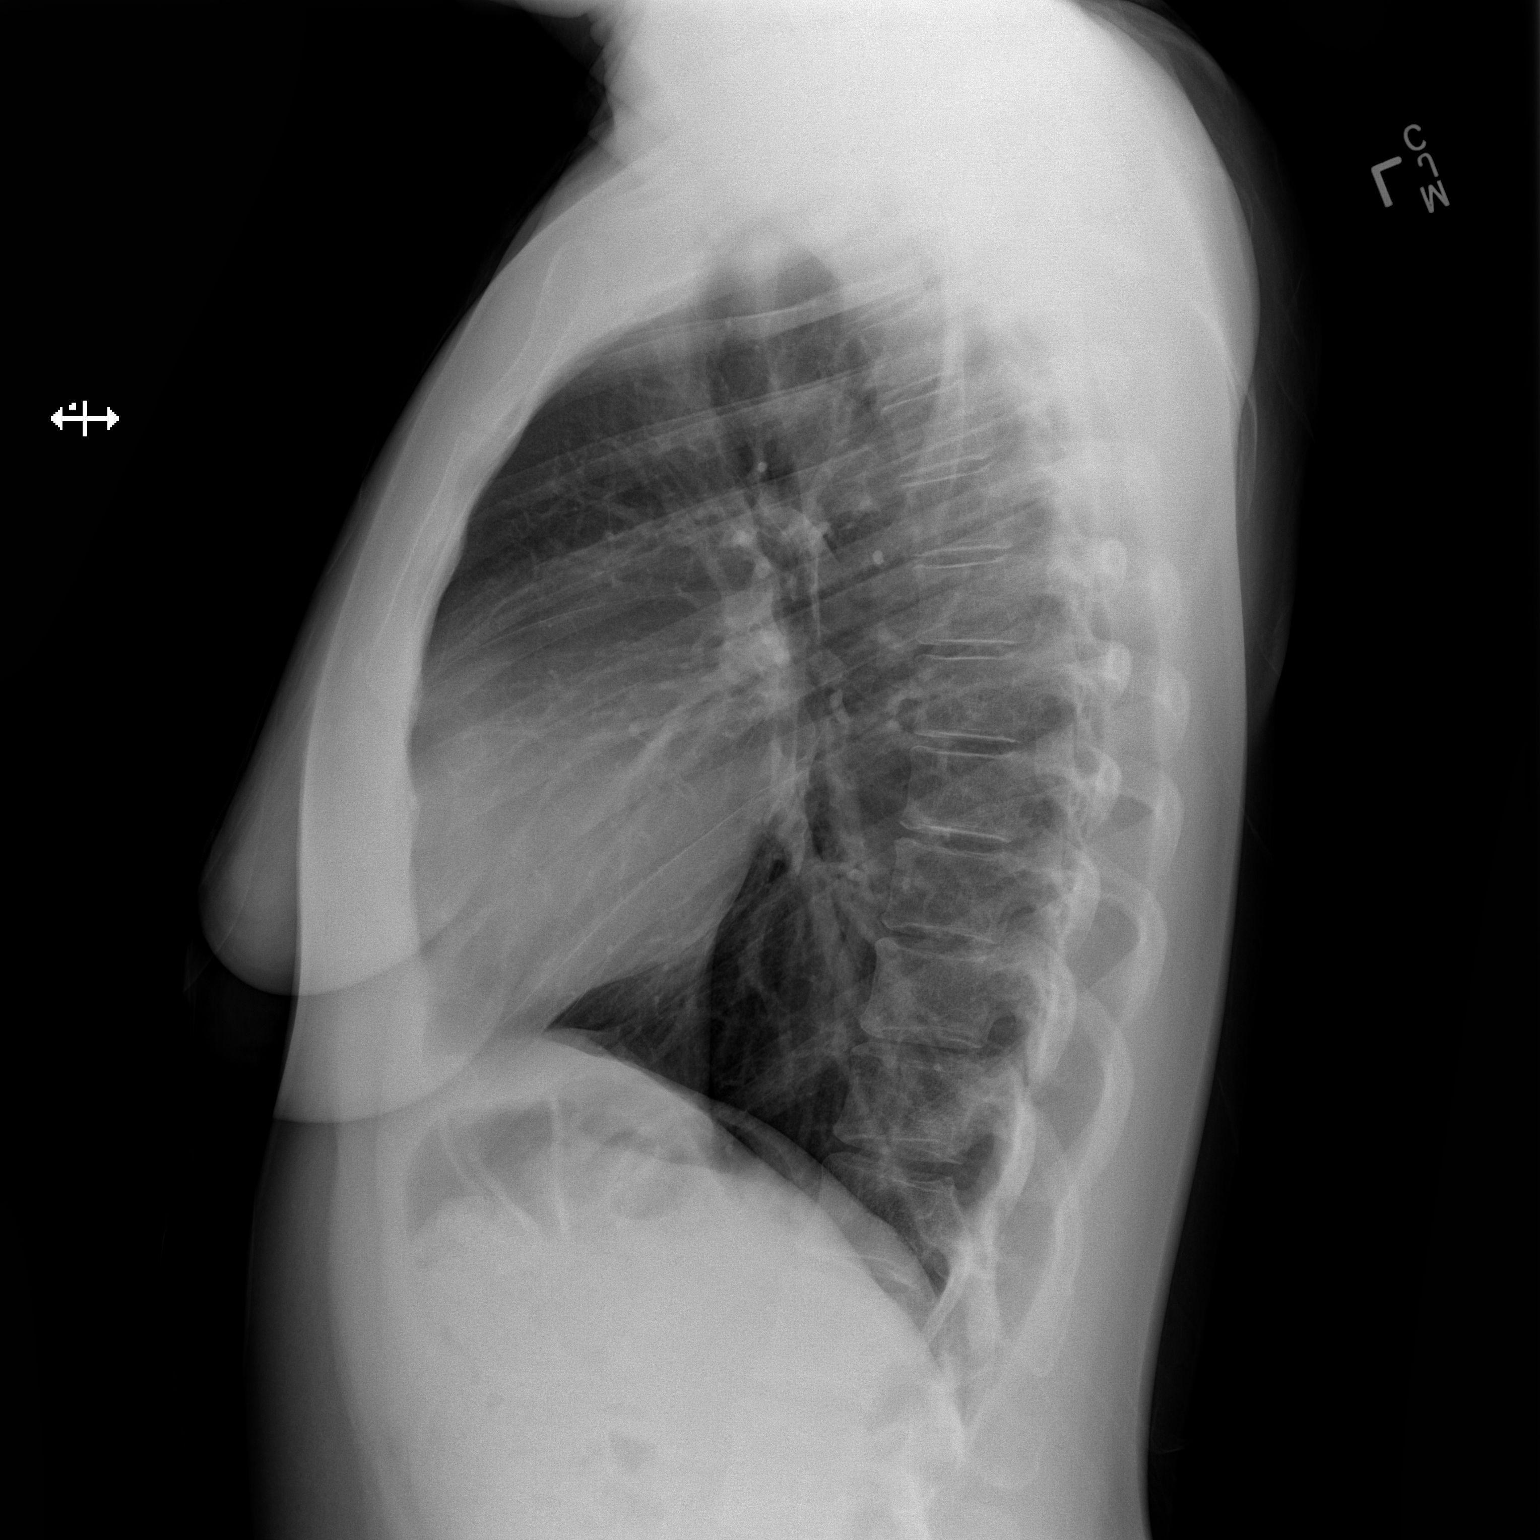

[2 of 2 positions shown; findings below may reference images not displayed]

FINDINGS: Mild hyperinflation. Normal heart size and pulmonary vascularity. No
focal airspace disease or consolidation in the lungs. No blunting of
costophrenic angles. No pneumothorax. Mediastinal contours appear
intact.
IMPRESSION: No active cardiopulmonary disease.

## 2017-07-02 ENCOUNTER — Encounter (HOSPITAL_COMMUNITY): Payer: Self-pay

## 2017-07-02 ENCOUNTER — Other Ambulatory Visit: Payer: Self-pay

## 2017-07-02 DIAGNOSIS — K0889 Other specified disorders of teeth and supporting structures: Secondary | ICD-10-CM | POA: Diagnosis present

## 2017-07-02 DIAGNOSIS — K047 Periapical abscess without sinus: Secondary | ICD-10-CM | POA: Diagnosis not present

## 2017-07-02 NOTE — ED Triage Notes (Signed)
Pt report R lower dental pain x4 days. She also feels that her R submandibular lymph node is swollen. She states that her R lower gums are bleeding more when brushing. A&Ox4. No fevers.

## 2017-07-03 ENCOUNTER — Emergency Department (HOSPITAL_COMMUNITY)
Admission: EM | Admit: 2017-07-03 | Discharge: 2017-07-03 | Disposition: A | Discharge status: Home / Self Care | Payer: Managed Care, Other (non HMO) | Attending: Emergency Medicine | Admitting: Emergency Medicine

## 2017-07-03 DIAGNOSIS — K0889 Other specified disorders of teeth and supporting structures: Secondary | ICD-10-CM

## 2017-07-03 DIAGNOSIS — K047 Periapical abscess without sinus: Secondary | ICD-10-CM

## 2017-07-03 MED ORDER — CLINDAMYCIN HCL 150 MG PO CAPS: 450.0000 mg | ORAL_CAPSULE | Freq: Three times a day (TID) | ORAL | 0 refills | Status: DC

## 2017-07-03 MED ORDER — IBUPROFEN 800 MG PO TABS: 800.0000 mg | ORAL_TABLET | Freq: Three times a day (TID) | ORAL | 0 refills | Status: DC

## 2017-07-03 MED ORDER — IBUPROFEN 800 MG PO TABS
800.0000 mg | ORAL_TABLET | Freq: Once | ORAL | Status: AC
  Administered 2017-07-03: 800 mg via ORAL
  Filled 2017-07-03: qty 1

## 2017-07-03 NOTE — ED Provider Notes (Signed)
[facility]-EMERGENCY DEPT Provider Note   CSN: [id number] Arrival date & time: 07/02/17  2326     History   Chief Complaint Chief Complaint  Patient presents with  . Dental Pain  . Lymphadenopathy    HPI Regina Terrell is a 32 y.o. female with no major medical problems presents to the Emergency Department complaining of gradual, persistent, progressively worsening R lower dental pain onset 4 days ago. Associated symptoms include mild right submandibular node swelling.  Pt also reports bleeding the painful site with brushing.  Patient reports taking ibuprofen with improvement in her pain.  She also reports taking 1 tablet of amoxicillin several days ago without improvement..  Pt denies fever, chills, headache, neck pain, difficulty swallowing.  Patient reports she has an appointment with her dentist in 5 days.   The history is provided by the patient and medical records. No language interpreter was used.    Past Medical History:  Diagnosis Date  . Polymorphous light eruption     Patient Active Problem List   Diagnosis Date Noted  . Murmur 04/02/2015  . Palpitations 04/02/2015  . Dyspnea 03/15/2015  . MRSA infection 04/08/2011    Past Surgical History:  Procedure Laterality Date  . No previous surgery      OB History    No data available       Home Medications    Prior to Admission medications   Medication Sig Start Date End Date Taking? Authorizing Provider  clindamycin (CLEOCIN) 150 MG capsule Take 3 capsules (450 mg total) by mouth 3 (three) times daily. 07/03/17   Levina Boyack, Dahlia ClientHannah, PA-C  ibuprofen (ADVIL,MOTRIN) 800 MG tablet Take 1 tablet (800 mg total) by mouth 3 (three) times daily. 07/03/17   Lasheena Frieze, Dahlia ClientHannah, PA-C    Family History Family History  Problem Relation Age of Onset  . Cirrhosis Father   . Hypertension Maternal Grandmother   . Diabetes Paternal Grandmother   . Heart attack Paternal Grandfather      Social History Social History   Tobacco Use  . Smoking status: Never Smoker  . Smokeless tobacco: Never Used  Substance Use Topics  . Alcohol use: Yes    Comment: occ  . Drug use: No     Allergies   Patient has no known allergies.   Review of Systems Review of Systems  Constitutional: Negative for appetite change, chills and fever.  HENT: Positive for dental problem. Negative for drooling, ear pain, nosebleeds, postnasal drip, rhinorrhea and trouble swallowing.   Eyes: Negative for pain and redness.  Respiratory: Negative for cough and wheezing.   Cardiovascular: Negative for chest pain.  Gastrointestinal: Negative for abdominal pain, nausea and vomiting.  Musculoskeletal: Negative for neck pain and neck stiffness.  Skin: Negative for color change and rash.  Neurological: Negative for weakness, light-headedness and headaches.  Hematological: Positive for adenopathy.  All other systems reviewed and are negative.    Physical Exam Updated Vital Signs BP (!) 132/92 (BP Location: Left Arm)   Pulse 82   Temp 98.5 F (36.9 C) (Oral)   Resp 16   SpO2 100%   Physical Exam  Constitutional: She appears well-developed and well-nourished.  HENT:  Head: Normocephalic.  Right Ear: External ear normal.  Left Ear: External ear normal.  Nose: Nose normal. Right sinus exhibits no maxillary sinus tenderness and no frontal sinus tenderness. Left sinus exhibits no maxillary sinus tenderness and no frontal sinus tenderness.  Mouth/Throat: Uvula is midline, oropharynx is clear and moist  and mucous membranes are normal. No oral lesions. Abnormal dentition. Dental caries present. No uvula swelling or lacerations. No oropharyngeal exudate, posterior oropharyngeal edema, posterior oropharyngeal erythema or tonsillar abscesses.  Right posterior molar with gingival erythema and swelling No gross abscess No fluctuance or induration to the buccal mucosa or floor of the mouth  Eyes:  Conjunctivae are normal. Right eye exhibits no discharge. Left eye exhibits no discharge.  Neck: Normal range of motion. Neck supple.  No stridor Handling secretions without difficulty No nuchal rigidity  Cardiovascular: Normal rate, regular rhythm and normal heart sounds.  Pulmonary/Chest: Effort normal. No respiratory distress.  Equal chest rise  Abdominal: Soft. Bowel sounds are normal. She exhibits no distension. There is no tenderness.  Lymphadenopathy:       Head (right side): Submandibular and tonsillar adenopathy present. No submental, no preauricular, no posterior auricular and no occipital adenopathy present.       Head (left side): No submental, no submandibular, no tonsillar, no preauricular, no posterior auricular and no occipital adenopathy present.    She has no cervical adenopathy.  Neurological: She is alert.  Skin: Skin is warm and dry.  Psychiatric: She has a normal mood and affect.  Nursing note and vitals reviewed.    ED Treatments / Results   Procedures Procedures (including critical care time)  Medications Ordered in ED Medications - No data to display   Initial Impression / Assessment and Plan / ED Course  I have reviewed the triage vital signs and the nursing notes.  Pertinent labs & imaging results that were available during my care of the patient were reviewed by me and considered in my medical decision making (see chart for details).     Patient with toothache.  No gross abscess.  Exam unconcerning for Ludwig's angina or spread of infection.  Will treat with penicillin and anti-inflammatories medicine.  Urged patient to follow-up with dentist at already scheduled appointment.   Final Clinical Impressions(s) / ED Diagnoses   Final diagnoses:  Pain, dental  Dental infection    ED Discharge Orders        Ordered    clindamycin (CLEOCIN) 150 MG capsule  3 times daily     07/03/17 0105    ibuprofen (ADVIL,MOTRIN) 800 MG tablet  3 times daily      07/03/17 0105       Tatiyana Foucher, Boyd Kerbs 07/03/17 0113    Lorre Nick, MD 07/03/17 1354

## 2017-07-03 NOTE — Discharge Instructions (Signed)
1. Medications: alternate ibuprofen and acetaminophen, clindamycin (please complete the entire course), usual home medications 2. Treatment: rest, drink plenty of fluids, take medications as prescribed 3. Follow Up: Please followup with dentistry at your appointment on Wednesday for discussion of your diagnoses and further evaluation after today's visit; if you do not have a primary care doctor use the resource guide provided to find one; Return to the ER for high fevers, difficulty breathing, difficulty swallowing or other concerning symptoms

## 2017-10-19 ENCOUNTER — Inpatient Hospital Stay (HOSPITAL_COMMUNITY)
Admission: AD | Admit: 2017-10-19 | Discharge: 2017-10-19 | Disposition: A | Discharge status: Home / Self Care | Payer: Managed Care, Other (non HMO) | Source: Ambulatory Visit | Attending: Emergency Medicine | Admitting: Emergency Medicine

## 2017-10-19 ENCOUNTER — Other Ambulatory Visit: Payer: Self-pay

## 2017-10-19 ENCOUNTER — Inpatient Hospital Stay (HOSPITAL_COMMUNITY): Payer: Managed Care, Other (non HMO)

## 2017-10-19 ENCOUNTER — Encounter (HOSPITAL_COMMUNITY): Payer: Self-pay | Admitting: *Deleted

## 2017-10-19 DIAGNOSIS — R079 Chest pain, unspecified: Secondary | ICD-10-CM | POA: Insufficient documentation

## 2017-10-19 DIAGNOSIS — R05 Cough: Secondary | ICD-10-CM | POA: Diagnosis present

## 2017-10-19 DIAGNOSIS — R0981 Nasal congestion: Secondary | ICD-10-CM | POA: Diagnosis present

## 2017-10-19 DIAGNOSIS — R0789 Other chest pain: Secondary | ICD-10-CM

## 2017-10-19 HISTORY — DX: Other specified health status: Z78.9

## 2017-10-19 LAB — CBC WITH DIFFERENTIAL/PLATELET
BASOS ABS: 0 10*3/uL (ref 0.0–0.1)
Basophils Relative: 0 %
EOS ABS: 0.2 10*3/uL (ref 0.0–0.7)
EOS PCT: 3 %
HCT: 36.3 % (ref 36.0–46.0)
Hemoglobin: 11.9 g/dL — ABNORMAL LOW (ref 12.0–15.0)
LYMPHS ABS: 1.2 10*3/uL (ref 0.7–4.0)
Lymphocytes Relative: 23 %
MCH: 30.1 pg (ref 26.0–34.0)
MCHC: 32.8 g/dL (ref 30.0–36.0)
MCV: 91.9 fL (ref 78.0–100.0)
MONO ABS: 0.2 10*3/uL (ref 0.1–1.0)
Monocytes Relative: 3 %
Neutro Abs: 3.7 10*3/uL (ref 1.7–7.7)
Neutrophils Relative %: 71 %
PLATELETS: 217 10*3/uL (ref 150–400)
RBC: 3.95 MIL/uL (ref 3.87–5.11)
RDW: 12.9 % (ref 11.5–15.5)
WBC: 5.2 10*3/uL (ref 4.0–10.5)

## 2017-10-19 LAB — COMPREHENSIVE METABOLIC PANEL
ALT: 16 U/L (ref 0–44)
AST: 18 U/L (ref 15–41)
Albumin: 3.3 g/dL — ABNORMAL LOW (ref 3.5–5.0)
Alkaline Phosphatase: 59 U/L (ref 38–126)
Anion gap: 7 (ref 5–15)
BUN: 10 mg/dL (ref 6–20)
CHLORIDE: 105 mmol/L (ref 98–111)
CO2: 24 mmol/L (ref 22–32)
CREATININE: 0.81 mg/dL (ref 0.44–1.00)
Calcium: 8.8 mg/dL — ABNORMAL LOW (ref 8.9–10.3)
Glucose, Bld: 96 mg/dL (ref 70–99)
POTASSIUM: 4.2 mmol/L (ref 3.5–5.1)
SODIUM: 136 mmol/L (ref 135–145)
Total Bilirubin: 0.6 mg/dL (ref 0.3–1.2)
Total Protein: 7.2 g/dL (ref 6.5–8.1)

## 2017-10-19 LAB — URINALYSIS, ROUTINE W REFLEX MICROSCOPIC
BILIRUBIN URINE: NEGATIVE
GLUCOSE, UA: NEGATIVE mg/dL
HGB URINE DIPSTICK: NEGATIVE
Ketones, ur: NEGATIVE mg/dL
Leukocytes, UA: NEGATIVE
Nitrite: NEGATIVE
PROTEIN: NEGATIVE mg/dL
Specific Gravity, Urine: 1.01 (ref 1.005–1.030)
pH: 7 (ref 5.0–8.0)

## 2017-10-19 LAB — POCT PREGNANCY, URINE: PREG TEST UR: NEGATIVE

## 2017-10-19 LAB — TROPONIN I: Troponin I: <0.03 ng/mL (ref <0.03)

## 2017-10-19 MED ORDER — KETOROLAC TROMETHAMINE 30 MG/ML IJ SOLN
30.0000 mg | Freq: Once | INTRAMUSCULAR | Status: AC
  Administered 2017-10-19: 30 mg via INTRAVENOUS
  Filled 2017-10-19: qty 1

## 2017-10-19 MED ORDER — LACTATED RINGERS IV SOLN
INTRAVENOUS | Status: DC
Start: 2017-10-19 — End: 2017-10-20
  Administered 2017-10-19: 20:00:00 via INTRAVENOUS

## 2017-10-19 MED ORDER — SODIUM CHLORIDE 0.9 % IV SOLN: INTRAVENOUS | Status: DC

## 2017-10-19 MED ORDER — NAPROXEN 500 MG PO TABS: 500.0000 mg | ORAL_TABLET | Freq: Two times a day (BID) | ORAL | 0 refills | Status: DC

## 2017-10-19 NOTE — MAU Note (Signed)
Yesterday thought it was allergies,was feeling weak, coughing(non-productive), has sore throat now from cough. As she was getting out of the shower around 1700, started having pain in her chest? Heart, when she lifts her arm the pain is worse and moves up.  Kind of scared her. No fever

## 2017-10-19 NOTE — MAU Provider Note (Signed)
  History     CSN: [id number]  Arrival date and time: 10/19/17 1746   First Provider Initiated Contact with Patient 10/19/17 1853      Chief Complaint  Patient presents with  . Chest Pain   Chest Pain   This is a new problem. Episode onset: around 1700 today  The onset quality is sudden. The problem occurs constantly. The pain is at a severity of 7/10. The quality of the pain is described as dull. The pain radiates to the left shoulder. Pertinent negatives include no fever, nausea, shortness of breath or vomiting. The pain is aggravated by deep breathing and lifting arm. She has tried nothing for the symptoms. Past medical history comments: heart murmur  Prior diagnostic workup includes echocardiogram.    Past Medical History:  Diagnosis Date  . Medical history non-contributory   . Polymorphous light eruption     Past Surgical History:  Procedure Laterality Date  . NO PAST SURGERIES    . No previous surgery      Family History  Problem Relation Age of Onset  . Cirrhosis Father   . Hypertension Maternal Grandmother   . Diabetes Paternal Grandmother   . Heart attack Paternal Grandfather     Social History   Tobacco Use  . Smoking status: Never Smoker  . Smokeless tobacco: Never Used  Substance Use Topics  . Alcohol use: Yes    Comment: occ  . Drug use: No    Allergies: No Known Allergies  Medications Prior to Admission  Medication Sig Dispense Refill Last Dose  . clindamycin (CLEOCIN) 150 MG capsule Take 3 capsules (450 mg total) by mouth 3 (three) times daily. 90 capsule 0   . ibuprofen (ADVIL,MOTRIN) 800 MG tablet Take 1 tablet (800 mg total) by mouth 3 (three) times daily. 21 tablet 0     Review of Systems  Constitutional: Negative for chills and fever.  Respiratory: Negative for chest tightness and shortness of breath.   Cardiovascular: Positive for chest pain.  Gastrointestinal: Negative for nausea and vomiting.   Physical Exam   Blood pressure  132/81, pulse 85, temperature 98.4 F (36.9 C), temperature source Oral, resp. rate 18, height 5\' 6"  (1.676 m), weight 282 lb 12 oz (128.3 kg), last menstrual period 10/08/2017, SpO2 100 %.  Physical Exam  Nursing note and vitals reviewed. Constitutional: She is oriented to person, place, and time. She appears well-developed and well-nourished. No distress.  HENT:  Head: Normocephalic.  Cardiovascular: Normal rate.  Respiratory: Effort normal.  GI: Soft. There is no tenderness. There is no rebound.  Neurological: She is alert and oriented to person, place, and time.  Skin: Skin is warm and dry.  Psychiatric: She has a normal mood and affect.    MAU Course  Procedures  MDM 6:56 PM Consult with Dr. Adrian BlackwaterStinson. Discussed patient's complaint, and he reviewed EKG. He would like for us to draw CBC, CMET, troponin and transfer to [facility]   1910: [facility]  1941 [facility] on the unit. Assumes care of the patient  1945 Patient left the unit with [facility]    Assessment and Plan   1. Chest pain, unspecified type    [facility]   Heather Hogan 10/19/2017, 6:56 PM

## 2017-10-19 NOTE — ED Provider Notes (Signed)
[facility]   CSN: [id number] Arrival date & time: 10/19/17  1746     History   Chief Complaint Chief Complaint  Patient presents with  . Chest Pain    HPI Regina Terrell is a 32 y.o. female who presents to ED for evaluation of 3-hour history of left-sided chest pain.  States that the pain is worse with movement of her left arm and palpation.  Symptoms began after she took a shower.  She has been having a dry cough as well as nasal congestion consistent with her history of allergies that she usually gets during this time of the year.  She takes an antihistamine for it.  She was [facility] and [facility] prior to arrival here with unremarkable lab work including urinalysis, pregnancy test, CBC, BMP, troponin and EKG.  She has not tried any medicine to help with the pain.  Denies any shortness of breath, hemoptysis, leg swelling, recent surgeries, recent prolonged travel, OCP use, history of MI, DVT or PE, fever, abdominal pain, vomiting, injuries or falls.  Denies any tobacco or other drug use.  HPI  Past Medical History:  Diagnosis Date  . Medical history non-contributory   . Polymorphous light eruption     Patient Active Problem List   Diagnosis Date Noted  . Murmur 04/02/2015  . Palpitations 04/02/2015  . Dyspnea 03/15/2015  . MRSA infection 04/08/2011    Past Surgical History:  Procedure Laterality Date  . NO PAST SURGERIES    . No previous surgery       OB History    Gravida  0   Para  0   Term  0   Preterm  0   AB  0   Living  0     SAB  0   TAB  0   Ectopic  0   Multiple  0   Live Births  0            Home Medications    Prior to Admission medications   Medication Sig Start Date End Date Taking? Authorizing Provider  clindamycin (CLEOCIN) 150 MG capsule Take 3 capsules (450 mg total) by mouth 3 (three) times daily. 07/03/17   Muthersbaugh, Dahlia ClientHannah, PA-C    ibuprofen (ADVIL,MOTRIN) 800 MG tablet Take 1 tablet (800 mg total) by mouth 3 (three) times daily. 07/03/17   Muthersbaugh, Dahlia ClientHannah, PA-C    Family History Family History  Problem Relation Age of Onset  . Cirrhosis Father   . Hypertension Maternal Grandmother   . Diabetes Paternal Grandmother   . Heart attack Paternal Grandfather     Social History Social History   Tobacco Use  . Smoking status: Never Smoker  . Smokeless tobacco: Never Used  Substance Use Topics  . Alcohol use: Yes    Comment: occ  . Drug use: No     Allergies   Patient has no known allergies.   Review of Systems Review of Systems  Constitutional: Negative for appetite change, chills and fever.  HENT: Positive for congestion. Negative for ear pain, rhinorrhea, sneezing and sore throat.   Eyes: Negative for photophobia and visual disturbance.  Respiratory: Positive for cough. Negative for chest tightness, shortness of breath and wheezing.   Cardiovascular: Positive for chest pain. Negative for palpitations.  Gastrointestinal: Negative for abdominal pain, blood in stool, constipation, diarrhea, nausea and vomiting.  Genitourinary: Negative for dysuria, hematuria and urgency.  Musculoskeletal: Negative for myalgias.  Skin: Negative for rash.  Neurological: Negative for dizziness, weakness and light-headedness.     Physical Exam Updated Vital Signs BP 131/60 (BP Location: Right Arm)   Pulse 80   Temp 98 F (36.7 C) (Oral)   Resp 16   Ht 5\' 6"  (1.676 m)   Wt 128.3 kg (282 lb 12 oz)   LMP 10/08/2017   SpO2 100%   BMI 45.64 kg/m   Physical Exam  Constitutional: She appears well-developed and well-nourished. No distress.  Nontoxic-appearing and in no acute distress.  Speaking complete sentences without difficulty.  HENT:  Head: Normocephalic and atraumatic.  Nose: Nose normal.  Eyes: Conjunctivae and EOM are normal. Right eye exhibits no discharge. Left eye exhibits no discharge. No scleral  icterus.  Neck: Normal range of motion. Neck supple.  Cardiovascular: Normal rate, regular rhythm, normal heart sounds and intact distal pulses. Exam reveals no gallop and no friction rub.  No murmur heard. Pulmonary/Chest: Effort normal and breath sounds normal. No respiratory distress. She exhibits tenderness.    Abdominal: Soft. Bowel sounds are normal. She exhibits no distension. There is no tenderness. There is no guarding.  Musculoskeletal: Normal range of motion. She exhibits no edema.  No lower extremity edema, erythema or calf tenderness bilaterally.  Neurological: She is alert. She exhibits normal muscle tone. Coordination normal.  Skin: Skin is warm and dry. No rash noted.  Psychiatric: She has a normal mood and affect.  Nursing note and vitals reviewed.    ED Treatments / Results  Labs (all labs ordered are listed, but only abnormal results are displayed) Labs Reviewed  URINALYSIS, ROUTINE W REFLEX MICROSCOPIC - Abnormal; Notable for the following components:      Result Value   Color, Urine STRAW (*)    All other components within normal limits  CBC WITH DIFFERENTIAL/PLATELET - Abnormal; Notable for the following components:   Hemoglobin 11.9 (*)    All other components within normal limits  COMPREHENSIVE METABOLIC PANEL - Abnormal; Notable for the following components:   Calcium 8.8 (*)    Albumin 3.3 (*)    All other components within normal limits  TROPONIN I  TROPONIN I  POCT PREGNANCY, URINE    EKG EKG Interpretation  Date/Time:  Tuesday October 19 2017 20:25:26 EDT Ventricular Rate:  78 PR Interval:  140 QRS Duration: 86 QT Interval:  344 QTC Calculation: 392 R Axis:   16 Text Interpretation:  Normal sinus rhythm Nonspecific T wave abnormality Abnormal ECG No significant change since last tracing Confirmed by Linwood Dibbles [phone]) on 10/19/2017 8:30:28 PM   Radiology Dg Chest 2 View  Result Date: 10/19/2017 CLINICAL DATA:  Nonproductive cough and chest  pain starting at 1700 hours. EXAM: CHEST - 2 VIEW COMPARISON:  02/09/2015 FINDINGS: The heart size and mediastinal contours are within normal limits. Both lungs are clear. The visualized skeletal structures are unremarkable. IMPRESSION: No active cardiopulmonary disease. Electronically Signed   By: Tollie Eth M.D.   On: 10/19/2017 21:28    Procedures Procedures (including critical care time)  Medications Ordered in ED Medications  lactated ringers infusion ( Intravenous New Bag/Given 10/19/17 1930)  ketorolac (TORADOL) 30 MG/ML injection 30 mg (30 mg Intravenous Given 10/19/17 2038)     Initial Impression / Assessment and Plan / ED Course  I have reviewed the triage vital signs and the nursing notes.  Pertinent labs & imaging results that were available during my care of the patient were reviewed by me and considered in  my medical decision making (see chart for details).     32 year old female presents to ED for evaluation of left-sided chest pain that began approximately 3 hours prior to arrival after she took a shower.  States that the pain is worse with palpation and movement of her left shoulder.  No prior history of similar symptoms.  She is not taking any medicine to help with her symptoms.  Initial evaluation done at the [facility] showed negative troponin, unremarkable EKG, CBC, BMP and urinalysis all unremarkable.  On physical exam patient is overall well-appearing.  She does have left chest tenderness to palpation.  No abdominal tenderness to palpation noted.  Lungs are clear to auscultation bilaterally.  She is not tachycardic, tachypneic or hypoxic.  No lower extremity edema, erythema or calf tenderness bilaterally.  Repeat EKG here is unremarkable.  Delta troponin is negative.  Chest x-ray unremarkable Low risk by HEART score, PERC and Wells criteria negative.  Patient given anti-inflammatories with significant improvement in her symptoms.  Suspect that her symptoms are  musculoskeletal in nature.  Doubt cardiac or pulmonary cause of symptoms.  Will advise her to follow-up with her primary care provider and to return to ED for any severe worsening symptoms.  Portions of this note were generated with Scientist, clinical (histocompatibility and immunogenetics). Dictation errors may occur despite best attempts at proofreading.   Final Clinical Impressions(s) / ED Diagnoses   Final diagnoses:  Chest pain, unspecified type    ED Discharge Orders    None       Dietrich Pates, PA-C 10/19/17 2315    Linwood Dibbles, MD 10/19/17 [phone]

## 2017-10-19 NOTE — Discharge Instructions (Signed)
Return to the emergency room for worsening of your pain, coughing up blood, leg swelling, trouble breathing or wheezing.

## 2017-10-19 NOTE — ED Notes (Signed)
Patient transported to X-ray 

## 2017-10-19 NOTE — ED Notes (Signed)
No E-signature available; pt is stable for discharge and states understanding of discharge instructions  

## 2019-02-19 ENCOUNTER — Encounter (HOSPITAL_COMMUNITY): Payer: Self-pay

## 2019-02-19 ENCOUNTER — Ambulatory Visit (HOSPITAL_COMMUNITY)
Admission: EM | Admit: 2019-02-19 | Discharge: 2019-02-19 | Disposition: A | Discharge status: Home / Self Care | Payer: Managed Care, Other (non HMO) | Attending: Urgent Care | Admitting: Urgent Care

## 2019-02-19 ENCOUNTER — Other Ambulatory Visit: Payer: Self-pay

## 2019-02-19 DIAGNOSIS — Z20828 Contact with and (suspected) exposure to other viral communicable diseases: Secondary | ICD-10-CM | POA: Diagnosis not present

## 2019-02-19 DIAGNOSIS — B349 Viral infection, unspecified: Secondary | ICD-10-CM | POA: Diagnosis present

## 2019-02-19 NOTE — ED Provider Notes (Signed)
MRN: [id number] DOB: 12/15/1985  Subjective:   Regina Terrell is a 33 y.o. female presenting for 1 week history of mild sinus drainage, scratchy throat and occasional cough.  Patient had symptoms last week but feels much better now.  She would like to be tested for Covid just to make sure.  She presents with 2 other family members with similar symptoms.  No current facility-administered medications for this encounter.   Current Outpatient Medications:  .  clindamycin (CLEOCIN) 150 MG capsule, Take 3 capsules (450 mg total) by mouth 3 (three) times daily., Disp: 90 capsule, Rfl: 0 .  ibuprofen (ADVIL,MOTRIN) 800 MG tablet, Take 1 tablet (800 mg total) by mouth 3 (three) times daily., Disp: 21 tablet, Rfl: 0 .  naproxen (NAPROSYN) 500 MG tablet, Take 1 tablet (500 mg total) by mouth 2 (two) times daily., Disp: 30 tablet, Rfl: 0   No Known Allergies  Past Medical History:  Diagnosis Date  . Medical history non-contributory   . Polymorphous light eruption      Past Surgical History:  Procedure Laterality Date  . NO PAST SURGERIES    . No previous surgery      Review of Systems  Constitutional: Negative for fever and malaise/fatigue.  HENT: Negative for congestion, ear pain, sinus pain and sore throat.   Eyes: Negative for discharge and redness.  Respiratory: Negative for cough, hemoptysis, shortness of breath and wheezing.   Cardiovascular: Negative for chest pain.  Gastrointestinal: Negative for abdominal pain, diarrhea, nausea and vomiting.  Genitourinary: Negative for dysuria, flank pain and hematuria.  Musculoskeletal: Negative for myalgias.  Skin: Negative for rash.  Neurological: Negative for dizziness, weakness and headaches.  Psychiatric/Behavioral: Negative for depression and substance abuse.    Objective:   Vitals: BP 134/82 (BP Location: Left Arm)   Pulse 84   Temp 98.6 F (37 C) (Oral)   Resp 16   LMP 02/03/2019   SpO2 99%   Physical  Exam Constitutional:      General: She is not in acute distress.    Appearance: Normal appearance. She is well-developed. She is not ill-appearing, toxic-appearing or diaphoretic.  HENT:     Head: Normocephalic and atraumatic.     Nose: Nose normal.     Mouth/Throat:     Mouth: Mucous membranes are moist.  Eyes:     Extraocular Movements: Extraocular movements intact.     Pupils: Pupils are equal, round, and reactive to light.  Cardiovascular:     Rate and Rhythm: Normal rate and regular rhythm.     Pulses: Normal pulses.     Heart sounds: Normal heart sounds. No murmur. No friction rub. No gallop.   Pulmonary:     Effort: Pulmonary effort is normal. No respiratory distress.     Breath sounds: Normal breath sounds. No stridor. No wheezing, rhonchi or rales.  Skin:    General: Skin is warm and dry.     Findings: No rash.  Neurological:     Mental Status: She is alert and oriented to person, place, and time.  Psychiatric:        Mood and Affect: Mood normal.        Behavior: Behavior normal.        Thought Content: Thought content normal.      Assessment and Plan :   1. Viral illness     Patient denies having any active symptoms in the past several days.  Suspect that she is recovered from common cold/viral URI  but will test for COVID-19. Counseled patient on nature of COVID-19 including modes of transmission, diagnostic testing, management and supportive care.  Offered symptomatic relief. COVID 19 testing is pending. Counseled patient on potential for adverse effects with medications prescribed/recommended today, ER and return-to-clinic precautions discussed, patient verbalized understanding.     Wallis Bamberg, [place] 02/19/19 1628

## 2019-02-19 NOTE — ED Triage Notes (Signed)
Pt present exposure to covid 19, pt has had symptoms last week but nothing since then.

## 2019-02-19 NOTE — Discharge Instructions (Signed)

## 2019-02-20 LAB — NOVEL CORONAVIRUS, NAA (HOSP ORDER, SEND-OUT TO REF LAB; TAT 18-24 HRS): SARS-CoV-2, NAA: NOT DETECTED

## 2019-02-21 ENCOUNTER — Telehealth (HOSPITAL_COMMUNITY): Payer: Self-pay | Admitting: Emergency Medicine

## 2019-02-21 NOTE — Telephone Encounter (Signed)
Called and spoke to Regina Terrell her husband, about their nephew Regina Terrell being covid positive. Regina Terrell is watching Regina Terrell since his mom tested positive for covid. Called mother first, who requested I call her sister Regina Terrell and review his positive covid results with. Pt instructed that her own negative test may be a false negative due to recent exposure to nephew, and they must quarantine for 14 days and get tested if symptomatic. Pt verbalized understanding.

## 2019-02-24 ENCOUNTER — Telehealth (HOSPITAL_COMMUNITY): Payer: Self-pay | Admitting: Emergency Medicine

## 2019-02-24 NOTE — Telephone Encounter (Signed)
Patient called requesting a note stating its advised she quarantine for 14 days after exposure to covid. Reviewed with Dr. Meda Coffee, will have her set up mychart and send note. Pt verbalized understanding.

## 2019-03-02 ENCOUNTER — Telehealth: Payer: 59 | Admitting: Emergency Medicine

## 2019-03-02 DIAGNOSIS — Z20822 Contact with and (suspected) exposure to covid-19: Secondary | ICD-10-CM

## 2019-03-02 DIAGNOSIS — Z20828 Contact with and (suspected) exposure to other viral communicable diseases: Secondary | ICD-10-CM

## 2019-03-02 NOTE — Progress Notes (Signed)
E-Visit for Corona Virus Screening   Your current symptoms could be consistent with the coronavirus.  Many health care providers can now test patients at their office but not all are.  [place] has multiple testing sites. For information on our COVID testing locations and hours go to [email or web address]  Please quarantine yourself while awaiting your test results.  We are enrolling you in our [place] for [place] . Daily you will receive a questionnaire within the [facility] website. Our COVID 19 response team willl be monitoriing your responses daily.  You can go to one of the testing sites listed below, while they are opened (see hours). You do not need an order and will stay in your car during the test. You do need to self isolate until your results return and if positive 10 days from when your symptoms started and until you are 3 days fever free.    Testing Locations (Monday - Friday, 8 a.m. - 3:30 p.m.)  [place]: [facility] at [facility], [address] [place], [place]: [place], [place], [place], [place] (entrance off [address])   [place]: (Closed each Monday): Testing site relocated to the short stay covered drive at [facility] (Use the [employer] entrance to [facility] next to [place] is a respiratory illness with symptoms that are similar to the flu. Symptoms are typically mild to moderate, but there have been cases of severe illness and death due to the virus. The following symptoms may appear 2-14 days after exposure: . Fever . Cough . Shortness of breath or difficulty breathing . Chills . Repeated shaking with chills . Muscle pain . Headache . Sore throat . New loss of taste or smell . Fatigue . Congestion or runny nose . Nausea or vomiting . Diarrhea  It is vitally important that if you feel that you  have an infection such as this virus or any other virus that you stay home and away from places where you may spread it to others.  You should self-quarantine for 14 days if you have symptoms that could potentially be coronavirus or have been in close contact a with a person diagnosed with COVID-19 within the last 2 weeks. You should avoid contact with people age 61 and older.   You should wear a mask or cloth face covering over your nose and mouth if you must be around other people or animals, including pets (even at home). Try to stay at least 6 feet away from other people. This will protect the people around you.  You can use medication such as dayQuil/ nyquil  You may also take motrin as needed for fever.   Reduce your risk of any infection by using the same precautions used for avoiding the common cold or flu:  Marland Kitchen Wash your hands often with soap and warm water for at least 20 seconds.  If soap and water are not readily available, use an alcohol-based hand sanitizer with at least 60% alcohol.  . If coughing or sneezing, cover your mouth and nose by coughing or sneezing into the elbow areas of your shirt or coat, into a tissue or into your sleeve (not your hands). . Avoid shaking hands with others and consider head nods or verbal greetings only. . Avoid touching your eyes, nose, or mouth with unwashed hands.  . Avoid close contact with people who are sick. . Avoid  places or events with large numbers of people in one location, like concerts or sporting events. . Carefully consider travel plans you have or are making. . If you are planning any travel outside or inside the [place], visit the CDC's Travelers' Health webpage for the latest health notices. . If you have some symptoms but not all symptoms, continue to monitor at home and seek medical attention if your symptoms worsen. . If you are having a medical emergency, call 911.  HOME CARE . Only take medications as instructed by your medical  team. . Drink plenty of fluids and get plenty of rest. . A steam or ultrasonic humidifier can help if you have congestion.   GET HELP RIGHT AWAY IF YOU HAVE EMERGENCY WARNING SIGNS** FOR COVID-19. If you or someone is showing any of these signs seek emergency medical care immediately. Call 911 or proceed to your closest emergency facility if: . You develop worsening high fever. . Trouble breathing . Bluish lips or face . Persistent pain or pressure in the chest . New confusion . Inability to wake or stay awake . You cough up blood. . Your symptoms become more severe  **This list is not all possible symptoms. Contact your medical provider for any symptoms that are sever or concerning to you.   MAKE SURE YOU   Understand these instructions.  Will watch your condition.  Will get help right away if you are not doing well or get worse.  Your e-visit answers were reviewed by a board certified advanced clinical practitioner to complete your personal care plan.  Depending on the condition, your plan could have included both over the counter or prescription medications.  If there is a problem please reply once you have received a response from your provider.  Your safety is important to [place].  If you have drug allergies check your prescription carefully.    You can use MyChart to ask questions about today's visit, request a non-urgent call back, or ask for a work or school excuse for 24 hours related to this e-Visit. If it has been greater than 24 hours you will need to follow up with your provider, or enter a new e-Visit to address those concerns. You will get an e-mail in the next two days asking about your experience.  I hope that your e-visit has been valuable and will speed your recovery. Thank you for using e-visits.

## 2020-02-22 IMAGING — DX DG CHEST 2V
2 series · 2 of 2 positions shown · non-contrast
Comparison: 02/09/2015

CLINICAL DATA: Nonproductive cough and chest pain starting at 6811
hours.

EXAM:
CHEST - 2 VIEW

[w chest pa]
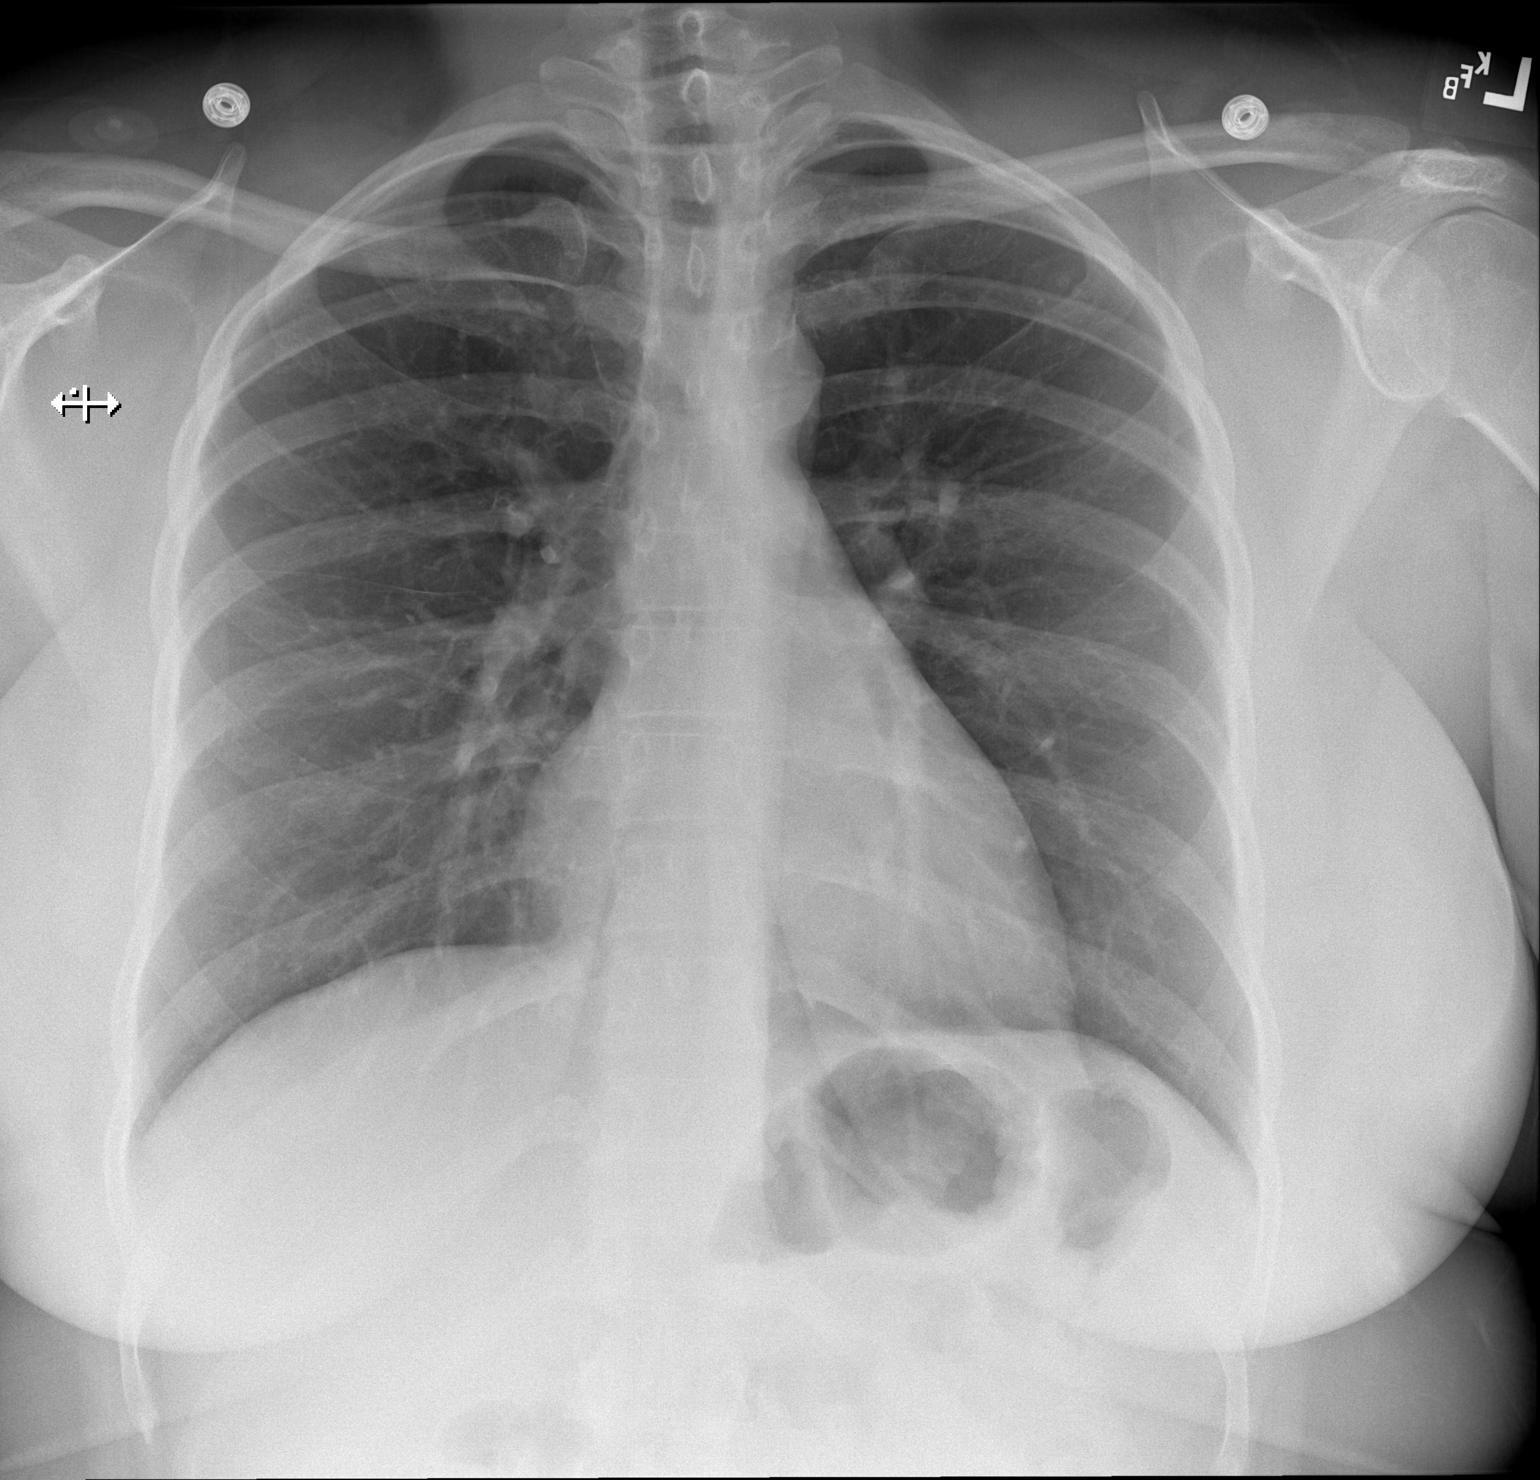

[w chest lat]
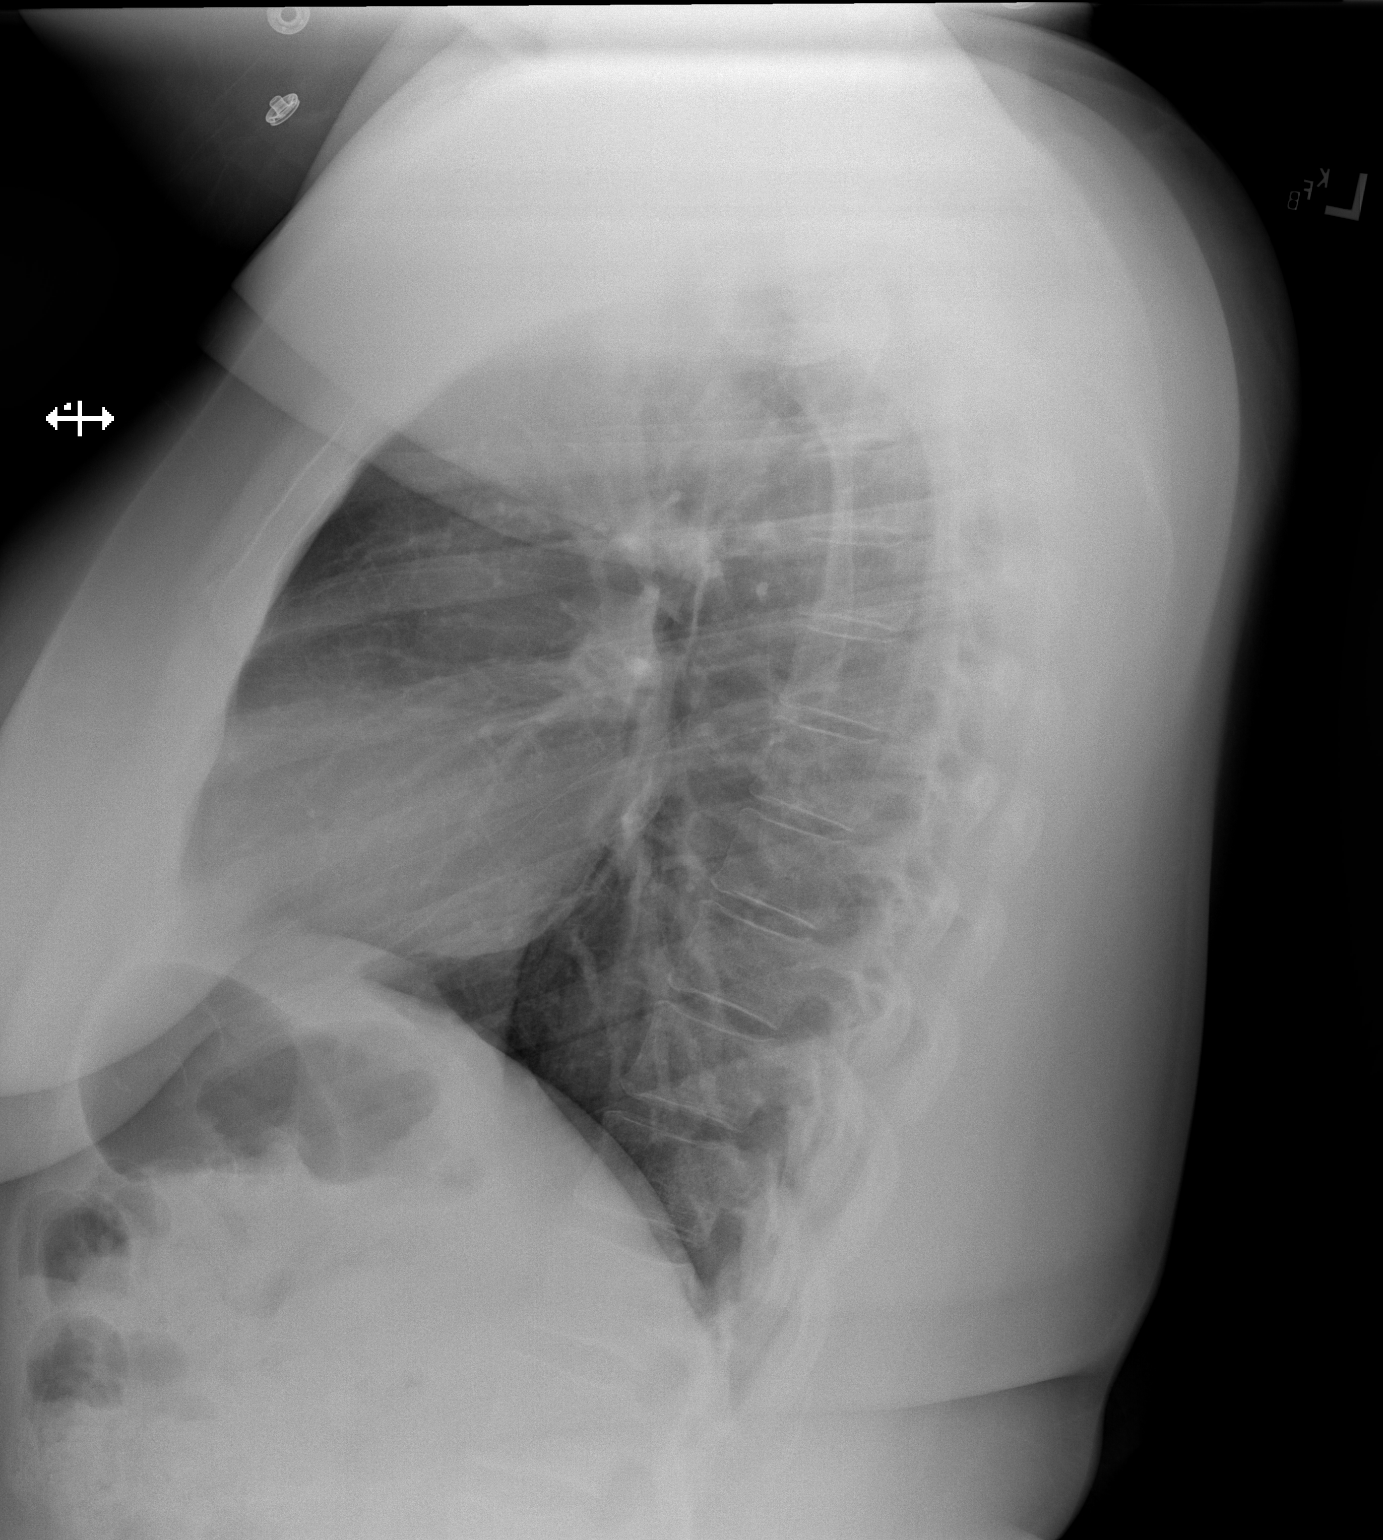

[2 of 2 positions shown; findings below may reference images not displayed]

FINDINGS: The heart size and mediastinal contours are within normal limits.
Both lungs are clear. The visualized skeletal structures are
unremarkable.
IMPRESSION: No active cardiopulmonary disease.

## 2020-05-15 ENCOUNTER — Institutional Professional Consult (permissible substitution): Payer: 59 | Admitting: Pulmonary Disease

## 2020-05-15 NOTE — Patient Instructions (Incomplete)
Thank you for visiting Dr. Eden Toohey at [facility]. Today we recommend the following: No orders of the defined types were placed in this encounter.  No orders of the defined types were placed in this encounter.  No follow-ups on file.    Please do your part to reduce the spread of COVID-19.    

## 2020-05-15 NOTE — Progress Notes (Deleted)
Synopsis: Referred in January 2022 for dyspnea by Farris Has, MD  Subjective:   PATIENT ID: Regina Terrell GENDER: female DOB: 23-Jun-1985, MRN: [id number]  No chief complaint on file.   Is a 35 year old female comes to the office today with complaints of dyspnea.  Past medical history ***.    ***  Past Medical History:  Diagnosis Date  . Medical history non-contributory   . Polymorphous light eruption      Family History  Problem Relation Age of Onset  . Cirrhosis Father   . Hypertension Maternal Grandmother   . Diabetes Paternal Grandmother   . Heart attack Paternal Grandfather      Past Surgical History:  Procedure Laterality Date  . NO PAST SURGERIES    . No previous surgery      Social History   Socioeconomic History  . Marital status: Single    Spouse name: Not on file  . Number of children: Not on file  . Years of education: Not on file  . Highest education level: Not on file  Occupational History  . Occupation: Hub-Control room agent    Comment: Works for [employer] ex  Tobacco Use  . Smoking status: Never Smoker  . Smokeless tobacco: Never Used  Substance and Sexual Activity  . Alcohol use: Yes    Comment: occ  . Drug use: No  . Sexual activity: Yes    Birth control/protection: None  Other Topics Concern  . Not on file  Social History Narrative  . Not on file   Social Determinants of Health   Financial Resource Strain: Not on file  Food Insecurity: Not on file  Transportation Needs: Not on file  Physical Activity: Not on file  Stress: Not on file  Social Connections: Not on file  Intimate Partner Violence: Not on file     No Known Allergies   Outpatient Medications Prior to Visit  Medication Sig Dispense Refill  . cetirizine (ZYRTEC) 10 MG tablet Take 10 mg by mouth daily.    . hydrOXYzine (ATARAX/VISTARIL) 50 MG tablet Take 0.5 tablets by mouth 2 (two) times daily as needed.    Marland Kitchen ibuprofen (ADVIL) 200 MG tablet Take 1 tablet  by mouth every 6 (six) hours as needed.    . sulfamethoxazole-trimethoprim (BACTRIM DS) 800-160 MG tablet Take 1 tablet by mouth 2 (two) times daily.    . naproxen (NAPROSYN) 500 MG tablet Take 1 tablet (500 mg total) by mouth 2 (two) times daily. 30 tablet 0  . clindamycin (CLEOCIN) 150 MG capsule Take 3 capsules (450 mg total) by mouth 3 (three) times daily. 90 capsule 0  . ibuprofen (ADVIL,MOTRIN) 800 MG tablet Take 1 tablet (800 mg total) by mouth 3 (three) times daily. 21 tablet 0   No facility-administered medications prior to visit.    ROS   Objective:  Physical Exam   There were no vitals filed for this visit.   on *** LPM *** RA BMI Readings from Last 3 Encounters:  10/19/17 45.64 kg/m  04/02/15 28.55 kg/m  03/15/15 27.60 kg/m   Wt Readings from Last 3 Encounters:  10/19/17 282 lb 12 oz (128.3 kg)  04/02/15 176 lb 14.4 oz (80.2 kg)  03/15/15 171 lb (77.6 kg)     CBC    Component Value Date/Time   WBC 5.2 10/19/2017 1913   RBC 3.95 10/19/2017 1913   HGB 11.9 (L) 10/19/2017 1913   HCT 36.3 10/19/2017 1913   PLT 217 10/19/2017 1913  MCV 91.9 10/19/2017 1913   MCH 30.1 10/19/2017 1913   MCHC 32.8 10/19/2017 1913   RDW 12.9 10/19/2017 1913   LYMPHSABS 1.2 10/19/2017 1913   MONOABS 0.2 10/19/2017 1913   EOSABS 0.2 10/19/2017 1913   BASOSABS 0.0 10/19/2017 1913     Chest Imaging: Chest x-ray June 2019: No infiltrate no active cardio pulmonary findings.  No effusion. The patient's images have been independently reviewed by me.    Pulmonary Functions Testing Results: No flowsheet data found.  FeNO:   Pathology:   Echocardiogram:   Heart Catheterization:     Assessment & Plan:   No diagnosis found.  Discussion: ***   Current Outpatient Medications:  .  cetirizine (ZYRTEC) 10 MG tablet, Take 10 mg by mouth daily., Disp: , Rfl:  .  hydrOXYzine (ATARAX/VISTARIL) 50 MG tablet, Take 0.5 tablets by mouth 2 (two) times daily as needed., Disp: ,  Rfl:  .  ibuprofen (ADVIL) 200 MG tablet, Take 1 tablet by mouth every 6 (six) hours as needed., Disp: , Rfl:  .  sulfamethoxazole-trimethoprim (BACTRIM DS) 800-160 MG tablet, Take 1 tablet by mouth 2 (two) times daily., Disp: , Rfl:  .  naproxen (NAPROSYN) 500 MG tablet, Take 1 tablet (500 mg total) by mouth 2 (two) times daily., Disp: 30 tablet, Rfl: 0  I spent *** minutes dedicated to the care of this patient on the date of this encounter to include pre-visit review of records, face-to-face time with the patient discussing conditions above, post visit ordering of testing, clinical documentation with the electronic health record, making appropriate referrals as documented, and communicating necessary findings to members of the patients care team.   Josephine Igo, DO [facility] 05/15/2020 8:01 AM

## 2021-04-27 NOTE — L&D Delivery Note (Signed)
Operative Delivery Note At 1:08 PM a viable and healthy female was delivered via Vaginal, Vacuum Investment banker, operational).  Presentation: vertex; Position: Occiput,, Anterior; Station: +3.  Verbal consent: obtained from patient.  Risks and benefits discussed in detail.  Risks include, but are not limited to the risks of anesthesia, bleeding, infection, damage to maternal tissues, fetal cephalhematoma.  There is also the risk of inability to effect vaginal delivery of the head, or shoulder dystocia that cannot be resolved by established maneuvers, leading to the need for emergency cesarean section. Indication: terminal bradycardia APGAR: 9, 9; weight  pending.   Placenta status: spontaneous intact to path, .   Cord: short with the following complications:none .  Cord pH: n/a  Anesthesia:  epidural Instruments: mushroom vacuum Episiotomy: None Lacerations: bilateral Vaginal sulcus extend to labia majus; 2nd  deg Perineal. Cervix inspected . No laceration noted Suture Repair: 3.0 chromic Est. Blood Loss (mL): 540  Mom to postpartum.  Baby to Couplet care / Skin to Skin.  Sherrey North A Tymeshia Awan 04/26/2022, 2:25 PM

## 2021-10-06 LAB — OB RESULTS CONSOLE RUBELLA ANTIBODY, IGM: Rubella: IMMUNE

## 2021-10-06 LAB — HEPATITIS C ANTIBODY: HCV Ab: NEGATIVE

## 2021-10-06 LAB — OB RESULTS CONSOLE HEPATITIS B SURFACE ANTIGEN: Hepatitis B Surface Ag: NEGATIVE

## 2021-10-06 LAB — OB RESULTS CONSOLE HIV ANTIBODY (ROUTINE TESTING): HIV: NONREACTIVE

## 2022-03-13 LAB — OB RESULTS CONSOLE RPR: RPR: NONREACTIVE

## 2022-04-09 LAB — OB RESULTS CONSOLE GBS: GBS: NEGATIVE

## 2022-04-09 LAB — OB RESULTS CONSOLE ABO/RH: RH Type: POSITIVE

## 2022-04-26 ENCOUNTER — Inpatient Hospital Stay (HOSPITAL_COMMUNITY): Payer: Managed Care, Other (non HMO) | Admitting: Anesthesiology

## 2022-04-26 ENCOUNTER — Other Ambulatory Visit: Payer: Self-pay

## 2022-04-26 ENCOUNTER — Encounter (HOSPITAL_COMMUNITY): Payer: Self-pay

## 2022-04-26 ENCOUNTER — Inpatient Hospital Stay (HOSPITAL_COMMUNITY)
Admission: AD | Admit: 2022-04-26 | Discharge: 2022-04-28 | DRG: 806 | Disposition: A | Discharge status: Home / Self Care | Payer: Managed Care, Other (non HMO) | Attending: Obstetrics and Gynecology | Admitting: Obstetrics and Gynecology

## 2022-04-26 DIAGNOSIS — Z3A38 38 weeks gestation of pregnancy: Secondary | ICD-10-CM

## 2022-04-26 DIAGNOSIS — O9081 Anemia of the puerperium: Secondary | ICD-10-CM | POA: Diagnosis not present

## 2022-04-26 DIAGNOSIS — O99214 Obesity complicating childbirth: Secondary | ICD-10-CM | POA: Diagnosis present

## 2022-04-26 DIAGNOSIS — D62 Acute posthemorrhagic anemia: Secondary | ICD-10-CM | POA: Diagnosis not present

## 2022-04-26 DIAGNOSIS — O26893 Other specified pregnancy related conditions, third trimester: Secondary | ICD-10-CM | POA: Diagnosis present

## 2022-04-26 HISTORY — DX: Obesity complicating pregnancy, unspecified trimester: O99.210

## 2022-04-26 LAB — TYPE AND SCREEN
ABO/RH(D): B POS
Antibody Screen: NEGATIVE

## 2022-04-26 LAB — CBC
HCT: 32.1 % — ABNORMAL LOW (ref 36.0–46.0)
Hemoglobin: 10.5 g/dL — ABNORMAL LOW (ref 12.0–15.0)
MCH: 29.3 pg (ref 26.0–34.0)
MCHC: 32.7 g/dL (ref 30.0–36.0)
MCV: 89.7 fL (ref 80.0–100.0)
Platelets: 195 10*3/uL (ref 150–400)
RBC: 3.58 MIL/uL — ABNORMAL LOW (ref 3.87–5.11)
RDW: 15.8 % — ABNORMAL HIGH (ref 11.5–15.5)
WBC: 9 10*3/uL (ref 4.0–10.5)
nRBC: 0 % (ref 0.0–0.2)

## 2022-04-26 LAB — AMNISURE RUPTURE OF MEMBRANE (ROM) NOT AT ARMC: Amnisure ROM: POSITIVE

## 2022-04-26 LAB — RPR: RPR Ser Ql: NONREACTIVE

## 2022-04-26 MED ORDER — PRENATAL MULTIVITAMIN CH
1.0000 | ORAL_TABLET | Freq: Every day | ORAL | Status: DC
  Administered 2022-04-27: 1 via ORAL
  Filled 2022-04-26: qty 1

## 2022-04-26 MED ORDER — BENZOCAINE-MENTHOL 20-0.5 % EX AERO
1.0000 | INHALATION_SPRAY | CUTANEOUS | Status: DC | PRN
  Administered 2022-04-27: 1 via TOPICAL
  Filled 2022-04-26: qty 56

## 2022-04-26 MED ORDER — SENNOSIDES-DOCUSATE SODIUM 8.6-50 MG PO TABS
2.0000 | ORAL_TABLET | Freq: Every day | ORAL | Status: DC
  Administered 2022-04-27: 2 via ORAL
  Filled 2022-04-26: qty 2

## 2022-04-26 MED ORDER — SIMETHICONE 80 MG PO CHEW: 80.0000 mg | CHEWABLE_TABLET | ORAL | Status: DC | PRN

## 2022-04-26 MED ORDER — OXYCODONE-ACETAMINOPHEN 5-325 MG PO TABS: 2.0000 | ORAL_TABLET | ORAL | Status: DC | PRN

## 2022-04-26 MED ORDER — OXYCODONE HCL 5 MG PO TABS: 10.0000 mg | ORAL_TABLET | ORAL | Status: DC | PRN

## 2022-04-26 MED ORDER — OXYCODONE HCL 5 MG PO TABS: 5.0000 mg | ORAL_TABLET | ORAL | Status: DC | PRN

## 2022-04-26 MED ORDER — EPHEDRINE 5 MG/ML INJ: 10.0000 mg | INTRAVENOUS | Status: DC | PRN

## 2022-04-26 MED ORDER — OXYTOCIN-SODIUM CHLORIDE 30-0.9 UT/500ML-% IV SOLN: 1.0000 m[IU]/min | INTRAVENOUS | Status: DC

## 2022-04-26 MED ORDER — LACTATED RINGERS IV SOLN: 500.0000 mL | Freq: Once | INTRAVENOUS | Status: DC

## 2022-04-26 MED ORDER — PHENYLEPHRINE 80 MCG/ML (10ML) SYRINGE FOR IV PUSH (FOR BLOOD PRESSURE SUPPORT): 80.0000 ug | PREFILLED_SYRINGE | INTRAVENOUS | Status: DC | PRN

## 2022-04-26 MED ORDER — LIDOCAINE HCL (PF) 1 % IJ SOLN
30.0000 mL | INTRAMUSCULAR | Status: AC | PRN
  Administered 2022-04-26: 30 mL via SUBCUTANEOUS
  Filled 2022-04-26: qty 30

## 2022-04-26 MED ORDER — OXYTOCIN 10 UNIT/ML IJ SOLN: 10.0000 [IU] | Freq: Once | INTRAMUSCULAR | Status: DC

## 2022-04-26 MED ORDER — WITCH HAZEL-GLYCERIN EX PADS: 1.0000 | MEDICATED_PAD | CUTANEOUS | Status: DC | PRN

## 2022-04-26 MED ORDER — LACTATED RINGERS AMNIOINFUSION: INTRAVENOUS | Status: DC

## 2022-04-26 MED ORDER — LIDOCAINE HCL (PF) 1 % IJ SOLN
INTRAMUSCULAR | Status: DC | PRN
  Administered 2022-04-26: 2 mL via EPIDURAL
  Administered 2022-04-26: 5 mL via EPIDURAL
  Administered 2022-04-26: 3 mL via EPIDURAL

## 2022-04-26 MED ORDER — IBUPROFEN 600 MG PO TABS
600.0000 mg | ORAL_TABLET | Freq: Four times a day (QID) | ORAL | Status: DC
  Administered 2022-04-26 – 2022-04-28 (×6): 600 mg via ORAL
  Filled 2022-04-26 (×7): qty 1

## 2022-04-26 MED ORDER — DIBUCAINE (PERIANAL) 1 % EX OINT: 1.0000 | TOPICAL_OINTMENT | CUTANEOUS | Status: DC | PRN

## 2022-04-26 MED ORDER — OXYTOCIN-SODIUM CHLORIDE 30-0.9 UT/500ML-% IV SOLN
2.5000 [IU]/h | INTRAVENOUS | Status: DC
  Administered 2022-04-26: 2.5 [IU]/h via INTRAVENOUS
  Filled 2022-04-26: qty 500

## 2022-04-26 MED ORDER — FERROUS SULFATE 325 (65 FE) MG PO TABS
325.0000 mg | ORAL_TABLET | Freq: Two times a day (BID) | ORAL | Status: DC
  Administered 2022-04-26 – 2022-04-28 (×4): 325 mg via ORAL
  Filled 2022-04-26 (×4): qty 1

## 2022-04-26 MED ORDER — ONDANSETRON HCL 4 MG PO TABS: 4.0000 mg | ORAL_TABLET | ORAL | Status: DC | PRN

## 2022-04-26 MED ORDER — LACTATED RINGERS IV SOLN
INTRAVENOUS | Status: DC
  Administered 2022-04-26: 1000 mL via INTRAVENOUS

## 2022-04-26 MED ORDER — DIPHENHYDRAMINE HCL 25 MG PO CAPS: 25.0000 mg | ORAL_CAPSULE | Freq: Four times a day (QID) | ORAL | Status: DC | PRN

## 2022-04-26 MED ORDER — LACTATED RINGERS IV SOLN: 500.0000 mL | INTRAVENOUS | Status: DC | PRN

## 2022-04-26 MED ORDER — SOD CITRATE-CITRIC ACID 500-334 MG/5ML PO SOLN: 30.0000 mL | ORAL | Status: DC | PRN

## 2022-04-26 MED ORDER — OXYTOCIN BOLUS FROM INFUSION
333.0000 mL | Freq: Once | INTRAVENOUS | Status: AC
  Administered 2022-04-26: 333 mL via INTRAVENOUS

## 2022-04-26 MED ORDER — COCONUT OIL OIL: 1.0000 | TOPICAL_OIL | Status: DC | PRN

## 2022-04-26 MED ORDER — ONDANSETRON HCL 4 MG/2ML IJ SOLN: 4.0000 mg | Freq: Four times a day (QID) | INTRAMUSCULAR | Status: DC | PRN

## 2022-04-26 MED ORDER — ACETAMINOPHEN 325 MG PO TABS
650.0000 mg | ORAL_TABLET | ORAL | Status: DC | PRN
  Administered 2022-04-26 – 2022-04-27 (×3): 650 mg via ORAL
  Filled 2022-04-26 (×3): qty 2

## 2022-04-26 MED ORDER — DIPHENHYDRAMINE HCL 50 MG/ML IJ SOLN: 12.5000 mg | INTRAMUSCULAR | Status: DC | PRN

## 2022-04-26 MED ORDER — ACETAMINOPHEN 325 MG PO TABS: 650.0000 mg | ORAL_TABLET | ORAL | Status: DC | PRN

## 2022-04-26 MED ORDER — LACTATED RINGERS IV SOLN
500.0000 mL | Freq: Once | INTRAVENOUS | Status: AC
  Administered 2022-04-26: 500 mL via INTRAVENOUS

## 2022-04-26 MED ORDER — ONDANSETRON HCL 4 MG/2ML IJ SOLN: 4.0000 mg | INTRAMUSCULAR | Status: DC | PRN

## 2022-04-26 MED ORDER — OXYCODONE-ACETAMINOPHEN 5-325 MG PO TABS: 1.0000 | ORAL_TABLET | ORAL | Status: DC | PRN

## 2022-04-26 MED ORDER — DIPHENHYDRAMINE HCL 50 MG/ML IJ SOLN
25.0000 mg | Freq: Once | INTRAMUSCULAR | Status: AC
  Administered 2022-04-26: 25 mg via INTRAVENOUS
  Filled 2022-04-26: qty 1

## 2022-04-26 MED ORDER — ZOLPIDEM TARTRATE 5 MG PO TABS: 5.0000 mg | ORAL_TABLET | Freq: Every evening | ORAL | Status: DC | PRN

## 2022-04-26 MED ORDER — TERBUTALINE SULFATE 1 MG/ML IJ SOLN: 0.2500 mg | Freq: Once | INTRAMUSCULAR | Status: DC | PRN

## 2022-04-26 MED ORDER — FENTANYL-BUPIVACAINE-NACL 0.5-0.125-0.9 MG/250ML-% EP SOLN
12.0000 mL/h | EPIDURAL | Status: DC | PRN
  Administered 2022-04-26: 12 mL/h via EPIDURAL
  Filled 2022-04-26: qty 250

## 2022-04-26 NOTE — Progress Notes (Signed)
Called by RN regarding fetal heart rate deceleration  down to 80's after Epidural. BP was normal.  131/85  On arrival/ FHR  was back up. (+) accels noted Tracing reviewed . FHR decel down to 60's-90 x 7-8 mins with good variability Baseline 130-135. Good variability VE loose 4 cm /80/+1   small caput IUPC/ISE placed/ variable decel noted Amnioinfusion started  IMP: active phase Term Srom Variable deceleration due to cord compression P) right exaggerated sims. Fluid bolus. amnioinfusion

## 2022-04-26 NOTE — MAU Note (Signed)
.  Regina Terrell is a 36 y.o. at [redacted]w[redacted]d here in MAU reporting: Awoken from sleeping feeling sudden loss of fluid at 0136. Clear mucous discharge. Fern slide collected. Pt denies vaginal bleeding or bloody show.  States she is unsure if she is having contractions but feel uterine tightening with pressure "like I have to pee". Pt endorses + fetal movement. Denies complications or risk with pregnancy. GBS- HSV -   Pt also reported being constipated all day and was unsure if she had been contracting or needs to have BM. Last BM in waiting area at 0230 Onset of complaint: 0136  Pain score: 8 rectal  Vitals:   04/26/22 0300  BP: 125/74  Pulse: 75  Resp: 18  Temp: 97.9 F (36.6 C)  SpO2: 100%     FHT:135bpm Lab orders placed from triage:  mau labor

## 2022-04-26 NOTE — MAU Note (Signed)
External fetal and labor monitors off for transfer to LD. Pt remains well controlled with paced breathing and position change/standing. Monitor slipped while patient standing FHR prior to transfer 120-125bpm.

## 2022-04-26 NOTE — MAU Note (Signed)
Pt requesting an epidural-explained need for IV, lab results and transfer to LD. Voiced understanding.

## 2022-04-26 NOTE — Plan of Care (Signed)
  Problem: Education: Goal: Knowledge of General Education information will improve Description: Including pain rating scale, medication(s)/side effects and non-pharmacologic comfort measures Outcome: Progressing   Problem: Health Behavior/Discharge Planning: Goal: Ability to manage health-related needs will improve Outcome: Progressing   Problem: Clinical Measurements: Goal: Ability to maintain clinical measurements within normal limits will improve Outcome: Progressing Goal: Will remain free from infection Outcome: Progressing Goal: Diagnostic test results will improve Outcome: Progressing Goal: Respiratory complications will improve Outcome: Progressing Goal: Cardiovascular complication will be avoided Outcome: Progressing   Problem: Activity: Goal: Risk for activity intolerance will decrease Outcome: Progressing   Problem: Nutrition: Goal: Adequate nutrition will be maintained Outcome: Progressing   Problem: Coping: Goal: Level of anxiety will decrease Outcome: Progressing   Problem: Elimination: Goal: Will not experience complications related to bowel motility Outcome: Progressing Goal: Will not experience complications related to urinary retention Outcome: Progressing   Problem: Pain Managment: Goal: General experience of comfort will improve Outcome: Progressing   Problem: Safety: Goal: Ability to remain free from injury will improve Outcome: Progressing   Problem: Skin Integrity: Goal: Risk for impaired skin integrity will decrease Outcome: Progressing   Problem: Education: Goal: Knowledge of condition will improve Outcome: Progressing Goal: Individualized Educational Video(s) Outcome: Progressing Goal: Individualized Newborn Educational Video(s) Outcome: Progressing   Problem: Activity: Goal: Will verbalize the importance of balancing activity with adequate rest periods Outcome: Progressing Goal: Ability to tolerate increased activity will  improve Outcome: Progressing   Problem: Coping: Goal: Ability to identify and utilize available resources and services will improve Outcome: Progressing   Problem: Life Cycle: Goal: Chance of risk for complications during the postpartum period will decrease Outcome: Progressing   Problem: Role Relationship: Goal: Ability to demonstrate positive interaction with newborn will improve Outcome: Progressing   Problem: Skin Integrity: Goal: Demonstration of wound healing without infection will improve Outcome: Progressing   Problem: Education: Goal: Knowledge of condition will improve Outcome: Progressing Goal: Individualized Educational Video(s) Outcome: Progressing Goal: Individualized Newborn Educational Video(s) Outcome: Progressing   Problem: Activity: Goal: Will verbalize the importance of balancing activity with adequate rest periods Outcome: Progressing Goal: Ability to tolerate increased activity will improve Outcome: Progressing   Problem: Coping: Goal: Ability to identify and utilize available resources and services will improve Outcome: Progressing   Problem: Life Cycle: Goal: Chance of risk for complications during the postpartum period will decrease Outcome: Progressing   Problem: Role Relationship: Goal: Ability to demonstrate positive interaction with newborn will improve Outcome: Progressing   Problem: Skin Integrity: Goal: Demonstration of wound healing without infection will improve Outcome: Progressing   

## 2022-04-26 NOTE — H&P (Signed)
Regina Terrell is a 36 y.o. female presenting @ 3 6/7 wk with SROM 1:36 am. (+) ctx. Fern neg amni sure positive. GBS cx neg . OB History     Gravida  1   Para  0   Term  0   Preterm  0   AB  0   Living  0      SAB  0   IAB  0   Ectopic  0   Multiple  0   Live Births  0          Past Medical History:  Diagnosis Date   Obesity in pregnancy    Polymorphous light eruption    Past Surgical History:  Procedure Laterality Date   NO PAST SURGERIES     No previous surgery     Family History: family history includes Cirrhosis in her father; Diabetes in her paternal grandmother; Heart attack in her paternal grandfather; Hypertension in her maternal grandmother. Social History:  reports that she has never smoked. She has never used smokeless tobacco. She reports that she does not currently use alcohol. She reports that she does not use drugs.     Maternal Diabetes: No Genetic Screening: Normal Maternal Ultrasounds/Referrals: Normal Fetal Ultrasounds or other Referrals:  None Maternal Substance Abuse:  No Significant Maternal Medications:  None Significant Maternal Lab Results:  Group B Strep negative Number of Prenatal Visits:greater than 3 verified prenatal visits Other Comments:   obesity  Review of Systems History Dilation: 4 Effacement (%): 80 Station: Plus 1 Exam by:: Regina Avilla MD Blood pressure 125/79, pulse 88, temperature 98.4 F (36.9 C), temperature source Oral, resp. rate 17, height 5\' 6"  (1.676 m), weight (!) 146.8 kg, SpO2 100 %. Exam Physical Exam  Prenatal labs: ABO, Rh: --/--/B POS (12/31 0430) Antibody: NEG (12/31 0430) Rubella: Immune (06/12 0000) RPR: Nonreactive (11/17 0000)  HBsAg: Negative (06/12 0000)  HIV: Non-reactive (06/12 0000)  GBS: Negative/-- (12/14 0000)   Assessment/Plan: Active phase Term gestation SROM P) admit routine labs. Epidural. Pitocin prn.   Regina Terrell A Regina Terrell 04/26/2022, 7:45 AM

## 2022-04-26 NOTE — MAU Note (Signed)
Pt becoming increasingly more uncomfortable with contractions. SVE

## 2022-04-26 NOTE — Anesthesia Preprocedure Evaluation (Signed)
Anesthesia Evaluation  Patient identified by MRN, date of birth, ID band Patient awake    Reviewed: Allergy & Precautions, NPO status , Patient's Chart, lab work & pertinent test results  Airway Mallampati: II  TM Distance: >3 FB Neck ROM: Full    Dental  (+) Teeth Intact, Dental Advisory Given   Pulmonary neg pulmonary ROS   Pulmonary exam normal breath sounds clear to auscultation       Cardiovascular negative cardio ROS Normal cardiovascular exam Rhythm:Regular Rate:Normal     Neuro/Psych negative neurological ROS     GI/Hepatic negative GI ROS, Neg liver ROS,,,  Endo/Other    Morbid obesity (BMI 52.23)  Renal/GU negative Renal ROS     Musculoskeletal negative musculoskeletal ROS (+)    Abdominal   Peds  Hematology  (+) Blood dyscrasia, anemia Plt 195k   Anesthesia Other Findings Day of surgery medications reviewed with the patient.  Reproductive/Obstetrics (+) Pregnancy                             Anesthesia Physical Anesthesia Plan  ASA: 4  Anesthesia Plan: Epidural   Post-op Pain Management:    Induction:   PONV Risk Score and Plan: 2 and Treatment may vary due to age or medical condition  Airway Management Planned: Natural Airway  Additional Equipment:   Intra-op Plan:   Post-operative Plan:   Informed Consent: I have reviewed the patients History and Physical, chart, labs and discussed the procedure including the risks, benefits and alternatives for the proposed anesthesia with the patient or authorized representative who has indicated his/her understanding and acceptance.     Dental advisory given  Plan Discussed with:   Anesthesia Plan Comments: (Patient identified. Risks/Benefits/Options discussed with patient including but not limited to bleeding, infection, nerve damage, paralysis, failed block, incomplete pain control, headache, blood pressure changes,  nausea, vomiting, reactions to medication both or allergic, itching and postpartum back pain. Confirmed with bedside nurse the patient's most recent platelet count. Confirmed with patient that they are not currently taking any anticoagulation, have any bleeding history or any family history of bleeding disorders. Patient expressed understanding and wished to proceed. All questions were answered. )       Anesthesia Quick Evaluation

## 2022-04-26 NOTE — Anesthesia Procedure Notes (Signed)
Epidural Patient location during procedure: OB Start time: 04/26/2022 5:43 AM End time: 04/26/2022 5:50 AM  Staffing Anesthesiologist: Collene Schlichter, MD Performed: anesthesiologist   Preanesthetic Checklist Completed: patient identified, IV checked, risks and benefits discussed, monitors and equipment checked, pre-op evaluation and timeout performed  Epidural Patient position: sitting Prep: DuraPrep Patient monitoring: blood pressure and continuous pulse ox Approach: midline Location: L3-L4 Injection technique: LOR air  Needle:  Needle type: Tuohy  Needle gauge: 17 G Needle length: 9 cm Needle insertion depth: 8 cm Catheter size: 19 Gauge Catheter at skin depth: 13 cm Test dose: negative and Other (1% Lidocaine)  Additional Notes Patient identified.  Risk benefits discussed including failed block, incomplete pain control, headache, nerve damage, paralysis, blood pressure changes, nausea, vomiting, reactions to medication both toxic or allergic, and postpartum back pain.  Patient expressed understanding and wished to proceed.  All questions were answered.  Sterile technique used throughout procedure and epidural site dressed with sterile barrier dressing. No paresthesia or other complications noted. The patient did not experience any signs of intravascular injection such as tinnitus or metallic taste in mouth nor signs of intrathecal spread such as rapid motor block. Please see nursing notes for vital signs. Reason for block:procedure for pain

## 2022-04-26 NOTE — Lactation Note (Signed)
This note was copied from a baby's chart. Lactation Consultation Note  Patient Name: [name] PQZRA'Q Date: 04/26/2022 Reason for consult: Initial assessment;Primapara;1st time breastfeeding;NICU baby;Breastfeeding assistance Age:35 hours   Lactation conducted an initial assessment upon request. I assisted with hand expression and placement of infant, Mars, STS on parent's chest. I showed her how to latch infant in cross cradle hold. Infant is 5 hours old and very sleepy, but he latched briefly with a few suckles.  I provided basic breastfeeding education (day 1 feeding patterns) and recommended that she hand express frequently, and breastfeed on demand 8-12 times a day.  Feeding Mother's Current Feeding Choice: Breast Milk  LATCH Score Latch: Repeated attempts needed to sustain latch, nipple held in mouth throughout feeding, stimulation needed to elicit sucking reflex.  Audible Swallowing: None  Type of Nipple: Everted at rest and after stimulation  Comfort (Breast/Nipple): Soft / non-tender  Hold (Positioning): Assistance needed to correctly position infant at breast and maintain latch.  LATCH Score: 6    Interventions Interventions: Breast feeding basics reviewed;Assisted with latch;Skin to skin;Hand express;Breast compression;Adjust position;Education  Discharge Pump: Personal  Consult Status Consult Status: Follow-up Date: 04/27/22 Follow-up type: In-patient    Walker Shadow 04/26/2022, 6:50 PM

## 2022-04-27 LAB — CBC
HCT: 22.8 % — ABNORMAL LOW (ref 36.0–46.0)
Hemoglobin: 7.5 g/dL — ABNORMAL LOW (ref 12.0–15.0)
MCH: 29.6 pg (ref 26.0–34.0)
MCHC: 32.9 g/dL (ref 30.0–36.0)
MCV: 90.1 fL (ref 80.0–100.0)
Platelets: 144 10*3/uL — ABNORMAL LOW (ref 150–400)
RBC: 2.53 MIL/uL — ABNORMAL LOW (ref 3.87–5.11)
RDW: 15.8 % — ABNORMAL HIGH (ref 11.5–15.5)
WBC: 13 10*3/uL — ABNORMAL HIGH (ref 4.0–10.5)
nRBC: 0 % (ref 0.0–0.2)

## 2022-04-27 MED ORDER — SODIUM CHLORIDE 0.9 % IV SOLN
500.0000 mg | Freq: Once | INTRAVENOUS | Status: AC
  Administered 2022-04-27: 500 mg via INTRAVENOUS
  Filled 2022-04-27: qty 25

## 2022-04-27 NOTE — Anesthesia Postprocedure Evaluation (Signed)
Anesthesia Post Note  Patient: Regina Terrell  Procedure(s) Performed: AN AD HOC LABOR EPIDURAL     Patient location during evaluation: Mother Baby Anesthesia Type: Epidural Level of consciousness: awake, oriented and awake and alert Pain management: pain level controlled Vital Signs Assessment: post-procedure vital signs reviewed and stable Respiratory status: spontaneous breathing, respiratory function stable and nonlabored ventilation Cardiovascular status: stable Postop Assessment: adequate PO intake, no headache, able to ambulate, patient able to bend at knees and no apparent nausea or vomiting Anesthetic complications: no   No notable events documented.  Last Vitals:  Vitals:   04/27/22 0339 04/27/22 0642  BP: 139/67 123/69  Pulse: 82 85  Resp: 18 18  Temp:  36.7 C  SpO2: 99% 99%    Last Pain:  Vitals:   04/27/22 0642  TempSrc:   PainSc: 4    Pain Goal:                   [name]

## 2022-04-27 NOTE — Lactation Note (Addendum)
This note was copied from a baby's chart. Lactation Consultation Note  Patient Name: Regina Terrell GYIRS'W Date: 04/27/2022 Reason for consult: Primapara;1st time breastfeeding;Follow-up assessment;Early term 37-38.6wks;Infant weight loss (3 % weight loss, per mom baby last fed at 1300, and presently asleep. LC recommended to call with feeding cues for latch assessment and help as requested by mom for positioniing.) Age:36 hours Infant recently back from his circumcision and was having PKU done. It is time for a feeding, LC written her name on the [employer] and Birth Parent will call when ready for latch assistance from the [facility].  Maternal Data Does the patient have breastfeeding experience prior to this delivery?: No  Feeding Mother's Current Feeding Choice: Breast Milk  LATCH Score                    Lactation Tools Discussed/Used    Interventions Interventions: Breast feeding basics reviewed;Education  Discharge Pump: Personal  Consult Status Consult Status: Follow-up Date: 04/27/22 Follow-up type: In-patient    Regina Terrell 04/27/2022, 5:38 PM

## 2022-04-27 NOTE — Progress Notes (Signed)
PPD1 SVD:   S:  Pt reports feeling ok, had episode of dizziness/ Tolerating po/ Voiding without problems/ No n/v/ Bleeding is light/ Pain controlled withprescription NSAID's including ibuprofen (Motrin)  Newborn info LIVE female   O:  A & O x 3 / VS: Blood pressure 127/77, pulse 93, temperature 98.8 F (37.1 C), temperature source Oral, resp. rate 18, height 5\' 6"  (1.676 m), weight (!) 146.8 kg, SpO2 100 %, unknown if currently breastfeeding.  LABS:  Results for orders placed or performed during the hospital encounter of 04/26/22 (from the past 24 hour(s))  CBC     Status: Abnormal   Collection Time: 04/27/22  5:04 AM  Result Value Ref Range   WBC 13.0 (H) 4.0 - 10.5 K/uL   RBC 2.53 (L) 3.87 - 5.11 MIL/uL   Hemoglobin 7.5 (L) 12.0 - 15.0 g/dL   HCT 91.4 (L) 78.2 - 95.6 %   MCV 90.1 80.0 - 100.0 fL   MCH 29.6 26.0 - 34.0 pg   MCHC 32.9 30.0 - 36.0 g/dL   RDW 21.3 (H) 08.6 - 57.8 %   Platelets 144 (L) 150 - 400 K/uL   nRBC 0.0 0.0 - 0.2 %    I&O: I/O last 3 completed shifts: In: 0  Out: 1041 [Urine:500; Stool:1; Blood:540]   No intake/output data recorded.  Lungs: chest clear, no wheezing, rales, normal symmetric air entry  Heart: regular rate and rhythm, S1, S2 normal, no murmur, click, rub or gallop  Abdomen: utrus @ umb firm  Perineum: not inspected  Lochia: light  Extremities:no redness or tenderness in the calves or thighs, no edema    A/P: PPD # 1/ G1P1001 IDA exacerbated by acute blood loss  Doing well  Continue routine post partum orders  IV Venofer

## 2022-04-27 NOTE — Lactation Note (Signed)
This note was copied from a baby's chart. Lactation Consultation Note  Patient Name: Boy Aalijah Mims IONGE'X Date: 04/27/2022 Age: 36 hours old  Reason for consult: Primapara;1st time breastfeeding;Follow-up assessment;Early term 37-38.6wks;Infant weight loss (3 % weight loss, per mom baby last fed at 1300, and presently asleep. LC recommended to call with feeding cues for latch assessment and help as requested by mom for positioniing.) LC reviewed the doc flow sheets with parents and grandmother, and WNL for age.  Mom expressed she needed assistance with positioning at the next consult.  LC provided report to 3p LC.  Maternal Data Does the patient have breastfeeding experience prior to this delivery?: No  Feeding Mother's Current Feeding Choice: Breast Milk  Interventions Interventions: Breast feeding basics reviewed;Education  Discharge    Consult Status Consult Status: Follow-up Date: 04/27/22 Follow-up type: In-patient    [place] 04/27/2022, 3:10 PM

## 2022-04-28 MED ORDER — IBUPROFEN 600 MG PO TABS: 600.0000 mg | ORAL_TABLET | Freq: Four times a day (QID) | ORAL | 11 refills | Status: AC

## 2022-04-28 NOTE — Lactation Note (Signed)
This note was copied from a baby's chart. Lactation Consultation Note  Patient Name: Regina Terrell PNTIR'W Date: 04/28/2022 Age :  36 hours old  Reason for consult: Follow-up assessment, 7 % weight loss  As LC entered the room baby latched on the right breast with depth in the football position. LC offered  to assist to keep the baby latched and roll the baby on to the side.  LC provided a warm moist burp cloth to lay on the breast. Increased swallows noted and per mom comfortable.  LC discussed importance of baby being in and intermittent nutritive sucking pattern when latched.  LC reviewed the doc flow sheets and updated the doc flow sheets per dad. Wets and stools confirmed with parents.     Feeding Mother's Current Feeding Choice: Breast Milk  LATCH Score Latch:  (latched with depth)  Audible Swallowing:  (swallows noted)  Type of Nipple:  (nipple well rounded when baby released briefly)  Comfort (Breast/Nipple):  (per mom comfortable)  Hold (Positioning):  (mom latched the the 1st time and LC assisted the re-latch)      Lactation Tools Discussed/Used    Interventions Interventions: Breast feeding basics reviewed;Breast compression;Adjust position;Support pillows;Education  Discharge Pump: Personal;Hands Free  Consult Status Consult Status: Follow-up Date: 04/28/22 Follow-up type: In-patient    [place] 04/28/2022, 8:50 AM

## 2022-04-28 NOTE — Progress Notes (Signed)
PPD{NUMBERS 1-5:20334} SVD:   S:  Pt reports feeling ***/ Tolerating po/ Voiding without problems/ No n/v/ Bleeding is {Description; bleeding vaginal:11356}/ Pain controlled with{treatments; pain control med:13496}  Newborn info ***   O:  A & O x 3 ***/ VS: Blood pressure 131/62, pulse 81, temperature 97.8 F (36.6 C), temperature source Oral, resp. rate 18, height 5\' 6"  (1.676 m), weight (!) 146.8 kg, SpO2 100 %, unknown if currently breastfeeding.  LABS: No results found for this or any previous visit (from the past 24 hour(s)).  I&O: I/O last 3 completed shifts: In: 275 [IV Piggyback:275] Out: -    No intake/output data recorded.  Lungs: {Exam; lungs:5033}  Heart: {Exam; heart:5510}  Abdomen: {PE ABDOMEN POSTPARTUM OBGYN:313106}  Perineum: {exam; perineum:10172}  Lochia: ***  Extremities:{pe extremities ZO:109604}    A/P: PPD # ***/ G1P1001  Doing well  Continue routine post partum orders  ***

## 2022-04-28 NOTE — Discharge Summary (Signed)
Postpartum Discharge Summary  Date of Service updated***     Patient Name: Regina Terrell DOB: 05-Dec-1985 MRN: [id number]  Date of admission: 04/26/2022 Delivery date:04/26/2022  Delivering provider: Jadon Harbaugh  Date of discharge: 04/28/2022  Admitting diagnosis: Indication for care in labor or delivery [O75.9] Vacuum-assisted vaginal delivery [Z37.9] Intrauterine pregnancy: [redacted]w[redacted]d    Secondary diagnosis:  Principal Problem:   Indication for care in labor or delivery Active Problems:   Vacuum-assisted vaginal delivery  Additional problems: ***    Discharge diagnosis: {DX.:23714}                                              Post partum procedures:{Postpartum procedures:23558} Augmentation: {{OIZTIWPYKDXI:33825}Complications: {OB Labor/Delivery Complications:20784}  Hospital course: {Courses:23701}  Magnesium Sulfate received: {Mag received:30440022} BMZ received: {BMZ received:30440023} Rhophylac:{Rhophylac received:30440032} MKNL:{ZJQ:73419379}T-DaP:{Tdap:23962} Flu: {{KWI:09735}Transfusion:{Transfusion received:30440034}  Physical exam  Vitals:   04/27/22 0642 04/27/22 1330 04/27/22 2159 04/28/22 0553  BP: 123/69 127/77 122/69 131/62  Pulse: 85 93 80 81  Resp: _0 Temp: 98 F (36.7 C) 98.8 F (37.1 C) 98.5 F (36.9 C) 97.8 F (36.6 C)  TempSrc:  Oral  Oral  SpO2: 99% 100% 98% 100%  Weight:      Height:       General: {Exam; general:21111117} Lochia: {Desc; appropriate/inappropriate:30686::"appropriate"} Uterine Fundus: {Desc; firm/soft:30687} Incision: {Exam; incision:21111123} DVT Evaluation: {Exam; dvt:2111122} Labs: Lab Results  Component Value Date   WBC 13.0 (H) 04/27/2022   HGB 7.5 (L) 04/27/2022   HCT 22.8 (L) 04/27/2022   MCV 90.1 04/27/2022   PLT 144 (L) 04/27/2022      Latest Ref Rng & Units 10/19/2017    7:13 PM  CMP  Glucose 70 - 99 mg/dL 96   BUN 6 - 20 mg/dL 10   Creatinine 0.44 - 1.00 mg/dL 0.81    Sodium 135 - 145 mmol/L 136   Potassium 3.5 - 5.1 mmol/L 4.2   Chloride 98 - 111 mmol/L 105   CO2 22 - 32 mmol/L 24   Calcium 8.9 - 10.3 mg/dL 8.8   Total Protein 6.5 - 8.1 g/dL 7.2   Total Bilirubin 0.3 - 1.2 mg/dL 0.6   Alkaline Phos 38 - 126 U/L 59   AST 15 - 41 U/L 18   ALT 0 - 44 U/L 16    Edinburgh Score:    04/26/2022    6:59 PM  Edinburgh Postnatal Depression Scale Screening Tool  I have been able to laugh and see the funny side of things. 0  I have looked forward with enjoyment to things. 0  I have blamed myself unnecessarily when things went wrong. 0  I have been anxious or worried for no good reason. 2  I have felt scared or panicky for no good reason. 1  Things have been getting on top of me. 1  I have been so unhappy that I have had difficulty sleeping. 0  I have felt sad or miserable. 0  I have been so unhappy that I have been crying. 0  The thought of harming myself has occurred to me. 0  Edinburgh Postnatal Depression Scale Total 4      After visit meds:  Allergies as of 04/28/2022   No Known Allergies      Medication List     TAKE these medications  cetirizine 10 MG tablet Commonly known as: ZYRTEC Take 10 mg by mouth daily.   ibuprofen 600 MG tablet Commonly known as: ADVIL Take 1 tablet (600 mg total) by mouth every 6 (six) hours.   multivitamin-prenatal 27-0.8 MG Tabs tablet Take 1 tablet by mouth daily at 12 noon.         Discharge home in stable condition Infant Feeding: {Baby feeding:23562} Infant Disposition:{CHL IP OB HOME WITH MOTHER:23581} Discharge instruction: per After Visit Summary and Postpartum booklet. Activity: Advance as tolerated. Pelvic rest for 6 weeks.  Diet: {OB diet:21111121} Anticipated Birth Control: {Birth Control:23956} Postpartum Appointment:{Outpatient follow up:23559} Additional Postpartum F/U: {PP Procedure:23957} Future Appointments:No future appointments. Follow up Visit:  Follow-up Information      Cousins, Sheronette, MD Follow up in 6 week(s).   Specialty: Obstetrics and Gynecology Contact information: [address] [place] [address] [phone]                     04/28/2022 Sheronette A Cousins, MD   

## 2022-04-28 NOTE — Discharge Instructions (Signed)
Call if temperature greater than equal to 100.4, nothing per vagina for 4-6 weeks or severe nausea vomiting, increased incisional pain , drainage or redness in the incision site, no straining with bowel movements, showers no bathCall if temperature greater than equal to 100.4, nothing per vagina for 4-6 weeks or severe nausea vomiting, increased incisional pain , drainage or redness in the incision site, no straining with bowel movements, showers no bath 

## 2022-04-28 NOTE — Lactation Note (Signed)
This note was copied from a baby's chart. Lactation Consultation Note  Patient Name: Regina Terrell XIHWT'U Date: 04/28/2022 Reason for consult: Follow-up assessment;Primapara;1st time breastfeeding;Early term 37-38.6wks;Infant weight loss;Breastfeeding assistance (7% weight loss. 2nd [place] visit this am) Age:36 hours LC reviewed BF basics and steps for latching x 2 this am.  LC reviewed BF D/C teaching and the [facility] resources.  LC offered to request a LC O/P appt and parents receptive and aware they will receive a call from [facility] O/P.  [name] provided a written step plan as a review.  Maternal Data Has patient been taught Hand Expression?: Yes  Feeding Mother's Current Feeding Choice: Breast Milk  LATCH Score Latch: Grasps breast easily, tongue down, lips flanged, rhythmical sucking.  Audible Swallowing: Spontaneous and intermittent  Type of Nipple: Everted at rest and after stimulation  Comfort (Breast/Nipple): Soft / non-tender  Hold (Positioning): Assistance needed to correctly position infant at breast and maintain latch.  LATCH Score: 9   Lactation Tools Discussed/Used Tools: Pump;Flanges Flange Size: 24;27;Other (comment) (LC checked flange and the #24 F is a good fit , #27 F for when the milk comes in) Breast pump type: Manual Pump Education: Milk Storage;Setup, frequency, and cleaning  Interventions Interventions: Breast feeding basics reviewed;Assisted with latch;Skin to skin;Breast massage;Hand express;Pre-pump if needed;Reverse pressure;Breast compression;Adjust position;Support pillows;Position options;Hand pump;Education;LC Services brochure  Discharge Discharge Education: Engorgement and breast care;Outpatient recommendation;Outpatient Epic message sent;Other (comment) Pump: Personal;Hands Free;Manual  Consult Status Consult Status: Complete Date: 04/28/22 Follow-up type: In-patient    [place] 04/28/2022, 11:21 AM

## 2022-04-29 LAB — SURGICAL PATHOLOGY

## 2022-05-04 ENCOUNTER — Telehealth (HOSPITAL_COMMUNITY): Payer: Self-pay | Admitting: *Deleted

## 2022-05-04 ENCOUNTER — Inpatient Hospital Stay (HOSPITAL_COMMUNITY)
Admission: AD | Admit: 2022-05-04 | Payer: Managed Care, Other (non HMO) | Source: Home / Self Care | Admitting: Obstetrics and Gynecology

## 2022-05-04 NOTE — Telephone Encounter (Signed)
Mom reports feeling good. No concerns about herself at this time. EPDS=2 [facility] score=4) Mom reports baby is doing well. Feeding, peeing, and pooping without difficulty. Safe sleep reviewed. Mom reports no concerns about baby at present.  Odis Hollingshead, RN 05-04-2022 at 9:30am

## 2022-05-28 ENCOUNTER — Other Ambulatory Visit: Payer: Self-pay | Admitting: Otolaryngology

## 2022-05-28 DIAGNOSIS — J3801 Paralysis of vocal cords and larynx, unilateral: Secondary | ICD-10-CM

## 2022-06-03 ENCOUNTER — Ambulatory Visit
Admission: RE | Admit: 2022-06-03 | Discharge: 2022-06-03 | Disposition: A | Discharge status: Home / Self Care | Payer: 59 | Source: Ambulatory Visit | Attending: Otolaryngology | Admitting: Otolaryngology

## 2022-06-03 DIAGNOSIS — J3801 Paralysis of vocal cords and larynx, unilateral: Secondary | ICD-10-CM

## 2022-06-03 MED ORDER — IOPAMIDOL (ISOVUE-300) INJECTION 61%
100.0000 mL | Freq: Once | INTRAVENOUS | Status: AC | PRN
  Administered 2022-06-03: 75 mL via INTRAVENOUS

## 2022-06-30 NOTE — Therapy (Deleted)
OUTPATIENT SPEECH LANGUAGE PATHOLOGY VOICE EVALUATION   Patient Name: Regina Terrell MRN: [id number] DOB:[birth year], 37 y.o., female Today's Date: 06/30/2022  PCP: London Pepper, MD REFERRING PROVIDER: London Pepper, MD  END OF SESSION:   Past Medical History:  Diagnosis Date   Obesity in pregnancy    Polymorphous light eruption    Past Surgical History:  Procedure Laterality Date   NO PAST SURGERIES     No previous surgery     Patient Active Problem List   Diagnosis Date Noted   Indication for care in labor or delivery 04/26/2022   Vacuum-assisted vaginal delivery 04/26/2022   Murmur 04/02/2015   Palpitations 04/02/2015   Dyspnea 03/15/2015   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  Rationale for Evaluation and Treatment: Rehabilitation  SUBJECTIVE:   SUBJECTIVE STATEMENT: *** Pt accompanied by: {accompnied:27141}  PERTINENT HISTORY: Per Dr. Theda Sers 06/25/2022 note, "The onset of the voice problem was 04/26/22 and associated with vaginal delivery. It wsan't sudden in onset. Since onset, it has been stable.She rates the voice a 4/10 on average. 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 Vocal demands: high. She is not a singer. Associated symptoms include: Vocal fatigue, vocal strain, breathlessness with speaking, difficulty with projection, throat clearing/globus, and dysphagia. New dysphagia to liquids.  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 {OPRCPAIN:27236}  FALLS: Has patient fallen in last 6 months? No, Number of falls: 0  LIVING ENVIRONMENT: Lives with: lives with their family Lives in: House/apartment  PLOF: Independent  PATIENT GOALS: ***  OBJECTIVE:   DIAGNOSTIC FINDINGS: 06/25/2022 Videostroboscopy was performed to further evaluate the patient's throat/voice complaint and was reviewed  with the patient. Pertinent findings include:  Nasopharynx: Clear. Minimal lymphoid studding is seen on the posterior pharyngeal wall Hypopharynx: No masses or abnormalities.  Larynx: Exam interpretation VF appearance: normal, no edema VF Mobility: RIGHT Vocal fold- fully mobile. LEFT Vocal fold- paralyzed, paramedian VF Atrophy: no vocal fold atrophy.  VF Vibration: periodic VF Wave: intact wave bilaterally VF Amplitude: intact amplitude bilaterally VF lesions: none VF closure: persistent slit like gap Laryngeal compression: moderate  No signs of reflux or post-cricoid edema.  No endolaryngeal mucous noted.   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[employer] Work voice demands: high Water intake: {waterintake:27189} Caffeine/alcohol intake: {intakelevel:27191} Daily voice use outside of work: {intakelevel:27191} Vocal abuse: {VA:27193}  PERCEPTUAL VOICE ASSESSMENT: Voice quality: {VQL:27192} Resonance: {resonance:27194} Respiratory function: {respbreathing:27195}  OBJECTIVE VOICE ASSESSMENT: Maximum phonation time for sustained "ah": *** Conversational pitch average: *** Hz Conversational pitch range: *** Hz Conversational loudness average: *** dB Conversational loudness range: *** dB S/z ratio: *** (Suggestive of dysfunction >1.0)  PATIENT REPORTED OUTCOME MEASURES (PROM): V-RQOL: ***  TODAY'S TREATMENT: 06-30-2022: ***   PATIENT EDUCATION: Education details: *** Person educated: {Person educated:25204} Education method: {Education Method:25205} Education comprehension: {Education Comprehension:25206}  HOME EXERCISE PROGRAM: ***  GOALS: Goals reviewed with patient? {yes/no:20286}  SHORT TERM GOALS: Target date: ***  *** Baseline: Goal status: {GOALSTATUS:25110}  2.  *** Baseline:  Goal status: {GOALSTATUS:25110}  3.  *** Baseline:  Goal status:  {GOALSTATUS:25110}  4.  *** Baseline:  Goal status: {GOALSTATUS:25110}  5.  *** Baseline:  Goal status: {GOALSTATUS:25110}  6.  *** Baseline:  Goal status: {GOALSTATUS:25110}  LONG TERM GOALS: Target date: ***  ***  Baseline:  Goal status: {GOALSTATUS:25110}  2.  *** Baseline:  Goal status: {GOALSTATUS:25110}  3.  *** Baseline:  Goal status: {GOALSTATUS:25110}  4.  *** Baseline:  Goal status: {GOALSTATUS:25110}  5.  *** Baseline:  Goal status: {GOALSTATUS:25110}  6.  *** Baseline:  Goal status: {GOALSTATUS:25110}  ASSESSMENT:  CLINICAL IMPRESSION: Patient is a *** y.o. *** who was seen today for *** s/p ***. Evaluation reveals {MILD/MOD/MARKED/SEVERE:28894} {SLPOBJIMP:27107}. Pt's {ST domains:29166} is c/b ***. Pt reports ***. Pt would benefit from skilled ST to address aforementioned deficits to {CI outcomes:29167}.    OBJECTIVE IMPAIRMENTS: include {SLPOBJIMP:27107}. These impairments are limiting patient from {SLPLIMIT:27108}. Factors affecting potential to achieve goals and functional outcome are {SLP factors:25450}.. Patient will benefit from skilled SLP services to address above impairments and improve overall function.  REHAB POTENTIAL: {rehabpotential:25112}  PLAN:  SLP FREQUENCY: {rehab frequency:25116}  SLP DURATION: {rehab duration:25117}  PLANNED INTERVENTIONS: {SLP treatment/interventions:25449}    Su Monks, CCC-SLP 06/30/2022, 1:26 PM

## 2022-07-01 ENCOUNTER — Encounter: Payer: 59 | Admitting: Speech Pathology

## 2023-09-04 ENCOUNTER — Emergency Department (HOSPITAL_BASED_OUTPATIENT_CLINIC_OR_DEPARTMENT_OTHER)
Admission: EM | Admit: 2023-09-04 | Discharge: 2023-09-04 | Disposition: A | Discharge status: Home / Self Care | Attending: Emergency Medicine | Admitting: Emergency Medicine

## 2023-09-04 ENCOUNTER — Other Ambulatory Visit (HOSPITAL_BASED_OUTPATIENT_CLINIC_OR_DEPARTMENT_OTHER): Payer: Self-pay

## 2023-09-04 ENCOUNTER — Encounter (HOSPITAL_BASED_OUTPATIENT_CLINIC_OR_DEPARTMENT_OTHER): Payer: Self-pay | Admitting: Emergency Medicine

## 2023-09-04 ENCOUNTER — Other Ambulatory Visit: Payer: Self-pay

## 2023-09-04 DIAGNOSIS — R251 Tremor, unspecified: Secondary | ICD-10-CM | POA: Diagnosis present

## 2023-09-04 HISTORY — DX: Anemia, unspecified: D64.9

## 2023-09-04 LAB — CBC WITH DIFFERENTIAL/PLATELET
Abs Immature Granulocytes: 0.01 10*3/uL (ref 0.00–0.07)
Basophils Absolute: 0 10*3/uL (ref 0.0–0.1)
Basophils Relative: 1 %
Eosinophils Absolute: 0.4 10*3/uL (ref 0.0–0.5)
Eosinophils Relative: 6 %
HCT: 34.6 % — ABNORMAL LOW (ref 36.0–46.0)
Hemoglobin: 11.2 g/dL — ABNORMAL LOW (ref 12.0–15.0)
Immature Granulocytes: 0 %
Lymphocytes Relative: 37 %
Lymphs Abs: 2.1 10*3/uL (ref 0.7–4.0)
MCH: 28.4 pg (ref 26.0–34.0)
MCHC: 32.4 g/dL (ref 30.0–36.0)
MCV: 87.8 fL (ref 80.0–100.0)
Monocytes Absolute: 0.4 10*3/uL (ref 0.1–1.0)
Monocytes Relative: 7 %
Neutro Abs: 2.8 10*3/uL (ref 1.7–7.7)
Neutrophils Relative %: 49 %
Platelets: 238 10*3/uL (ref 150–400)
RBC: 3.94 MIL/uL (ref 3.87–5.11)
RDW: 14.1 % (ref 11.5–15.5)
WBC: 5.8 10*3/uL (ref 4.0–10.5)
nRBC: 0 % (ref 0.0–0.2)

## 2023-09-04 LAB — URINALYSIS, ROUTINE W REFLEX MICROSCOPIC
Bacteria, UA: NONE SEEN
Bilirubin Urine: NEGATIVE
Glucose, UA: NEGATIVE mg/dL
Hgb urine dipstick: NEGATIVE
Ketones, ur: NEGATIVE mg/dL
Nitrite: NEGATIVE
Protein, ur: NEGATIVE mg/dL
Specific Gravity, Urine: <1.005 — ABNORMAL LOW (ref 1.005–1.030)
pH: 6 (ref 5.0–8.0)

## 2023-09-04 LAB — COMPREHENSIVE METABOLIC PANEL WITH GFR
ALT: 13 U/L (ref 0–44)
AST: 20 U/L (ref 15–41)
Albumin: 3.8 g/dL (ref 3.5–5.0)
Alkaline Phosphatase: 71 U/L (ref 38–126)
Anion gap: 10 (ref 5–15)
BUN: 8 mg/dL (ref 6–20)
CO2: 24 mmol/L (ref 22–32)
Calcium: 9.2 mg/dL (ref 8.9–10.3)
Chloride: 105 mmol/L (ref 98–111)
Creatinine, Ser: 0.89 mg/dL (ref 0.44–1.00)
GFR, Estimated: >60 mL/min (ref >60)
Glucose, Bld: 107 mg/dL — ABNORMAL HIGH (ref 70–99)
Potassium: 3.6 mmol/L (ref 3.5–5.1)
Sodium: 139 mmol/L (ref 135–145)
Total Bilirubin: 0.2 mg/dL (ref 0.0–1.2)
Total Protein: 7.6 g/dL (ref 6.5–8.1)

## 2023-09-04 LAB — PREGNANCY, URINE: Preg Test, Ur: NEGATIVE

## 2023-09-04 MED ORDER — HYDROXYZINE HCL 25 MG PO TABS: 25.0000 mg | ORAL_TABLET | Freq: Four times a day (QID) | ORAL | 0 refills | Status: AC | PRN

## 2023-09-04 MED ORDER — HYDROXYZINE HCL 25 MG PO TABS
25.0000 mg | ORAL_TABLET | Freq: Four times a day (QID) | ORAL | 0 refills | Status: DC | PRN
  Filled 2023-09-04: qty 12, 3d supply, fill #0

## 2023-09-04 NOTE — ED Provider Notes (Signed)
 10:09 AM Pt seen in conjunction with Gilliam PA-C. Pt with 2 or 3 days of tremor.  She notices in her eyes and facial muscles as well as her hands and feet.  She notices this more at night.  Patient seems a little anxious talking about this, but denies significant anxiety.  No any medications or changes.  She is not on any medications which would cause a dystonic type reaction.  History of sister with MS. symptoms are bilateral, no unilateral symptoms or weakness.  Symptoms can wax and wane during the day.  Currently she feels a little "jumpy" but no significant symptoms.  BP 137/76 (BP Location: Right Arm) Comment: Simultaneous filing. User may not have seen previous data.  Pulse (!) 104 Comment: Simultaneous filing. User may not have seen previous data.  Temp 98 F (36.7 C) (Oral) Comment: Simultaneous filing. User may not have seen previous data. Comment (Src): Simultaneous filing. User may not have seen previous data.  Resp 16 Comment: Simultaneous filing. User may not have seen previous data.  Wt (!) 158.8 kg   SpO2 94% Comment: Simultaneous filing. User may not have seen previous data.  BMI 56.49 kg/m   She follows with [facility].  Labs reviewed, mild anemia improved from previous, normal electrolytes, no UTI or pregnancy.  Symptoms are not suggestive of a stroke.  Grossly normal neuro exam. Agree with PCP f/u.    Lyna Sandhoff, PA-C 09/04/23 1012    Nora Beal Victoria K, [place] 09/04/23 1110

## 2023-09-04 NOTE — ED Triage Notes (Signed)
 Pt has had tremors in eyes, face, hand,legs for 3 days, worse at night and has worsened.   No recent illness. No fevers,is anemic.

## 2023-09-04 NOTE — ED Provider Notes (Signed)
 [place] EMERGENCY DEPARTMENT AT [facility] Provider Note   CSN: [id number] Arrival date & time: 09/04/23  4098     History Chief Complaint  Patient presents with   Tremors    HPI Regina Terrell is a 38 y.o. female presenting for a 2 to 3-day history of what she describes as a generalized tremor that she feels has gotten worse over the past 24 hours.  Her primary concern today was that when she tries to sleep she feels that her eyes are fluttering and she also has a tremor in her legs that is preventing her from getting quality sleep.  She denies any pain, dizziness, denies any vertigo, denies shortness of breath or chest pain.  Only new medication is a daily multivitamin that she started a month ago.  She also states that she is occasionally taken Tylenol  but denies taking this for pain stating that she took it feeling it might help relax her.  She has a known history of iron  deficiency anemia of which she was previously taking a iron  supplement but she states that she has not regularly taken this in over a month.  She states that her last menstrual cycle began on approximately 13 April show she is due to begin her period within the next few days.  She has no previous history of dysmenorrhea, and denies risk of pregnancy.  Only known previous medical history is of iron  deficiency anemia, and only known medications is occasional Tylenol  or Advil  as needed for pain and a daily multivitamin.   Patient's recorded medical, surgical, social, medication list and allergies were reviewed in the Snapshot window as part of the initial history.   Review of Systems   Review of Systems  Constitutional:  Negative for activity change, appetite change and fatigue.  Respiratory:  Negative for shortness of breath.   Cardiovascular:  Negative for chest pain, palpitations and leg swelling.  Musculoskeletal:  Negative for arthralgias, myalgias, neck pain and neck stiffness.  Neurological:   Negative for dizziness, seizures, syncope, weakness, light-headedness and headaches.  All other systems reviewed and are negative.   Physical Exam Updated Vital Signs BP 137/76 (BP Location: Right Arm) Comment: Simultaneous filing. User may not have seen previous data.  Pulse (!) 104 Comment: Simultaneous filing. User may not have seen previous data.  Temp 98 F (36.7 C) (Oral) Comment: Simultaneous filing. User may not have seen previous data. Comment (Src): Simultaneous filing. User may not have seen previous data.  Resp 16 Comment: Simultaneous filing. User may not have seen previous data.  Wt (!) 158.8 kg   SpO2 94% Comment: Simultaneous filing. User may not have seen previous data.  BMI 56.49 kg/m  Physical Exam Vitals and nursing note reviewed.  Constitutional:      General: She is not in acute distress.    Appearance: Normal appearance.  HENT:     Head: Normocephalic and atraumatic.     Mouth/Throat:     Mouth: Mucous membranes are moist.     Pharynx: Oropharynx is clear.  Eyes:     General: No visual field deficit.    Extraocular Movements: Extraocular movements intact.     Conjunctiva/sclera: Conjunctivae normal.     Pupils: Pupils are equal, round, and reactive to light.  Neck:     Thyroid: No thyroid mass, thyromegaly or thyroid tenderness.  Cardiovascular:     Rate and Rhythm: Normal rate and regular rhythm.     Pulses: Normal pulses.  Radial pulses are 2+ on the right side and 2+ on the left side.     Heart sounds: Normal heart sounds. No murmur heard.    No friction rub. No gallop.  Pulmonary:     Effort: Pulmonary effort is normal.     Breath sounds: Normal breath sounds.  Abdominal:     General: Abdomen is flat. Bowel sounds are normal.     Palpations: Abdomen is soft.  Musculoskeletal:        General: Normal range of motion.     Cervical back: Normal range of motion and neck supple.     Right lower leg: No edema.     Left lower leg: No edema.   Lymphadenopathy:     Cervical: No cervical adenopathy.  Skin:    General: Skin is warm and dry.     Capillary Refill: Capillary refill takes less than 2 seconds.  Neurological:     General: No focal deficit present.     Mental Status: She is alert and oriented to person, place, and time. Mental status is at baseline.     GCS: GCS eye subscore is 4. GCS verbal subscore is 5. GCS motor subscore is 6.     Cranial Nerves: Cranial nerves 2-12 are intact. No cranial nerve deficit, dysarthria or facial asymmetry.     Sensory: Sensation is intact.     Motor: Motor function is intact.     Coordination: Coordination is intact.     Gait: Gait is intact.     Deep Tendon Reflexes: Reflexes are normal and symmetric.  Psychiatric:        Attention and Perception: Attention and perception normal.        Mood and Affect: Mood and affect normal.        Speech: Speech normal.        Behavior: Behavior normal.        Thought Content: Thought content normal.        Cognition and Memory: Cognition and memory normal.        Judgment: Judgment normal.      ED Course/ Medical Decision Making/ A&P    Procedures Procedures   Medications Ordered in ED Medications - No data to display  Medical Decision Making:   Regina Terrell is a 38 y.o. female who presented to the ED today with 2 to 3-day history of generalized tremors detailed above.     Complete initial physical exam performed, notably the patient  was alert and oriented and in no apparent distress.  Of note she currently does not have any tremor and neurologic exam is normal and benign.  She is able to ambulate without assistance with a normal gait, and fine and gross motor coordination is intact without any tremor noted.     Reviewed and confirmed nursing documentation for past medical history, family history, social history.    Initial Assessment:   With the patient's presentation of episodic tremors, most likely diagnosis is  unspecified tremors. Other diagnoses were considered including (but not limited to) anemia, electrolyte abnormality, other acute neurologic disorder. These are considered less likely due to history of present illness and physical exam findings.     Initial Plan:  Urine hCG to evaluate for pregnancy Screening labs including CBC and Metabolic panel to evaluate for infectious or metabolic etiology of disease.  Urinalysis with reflex culture ordered to evaluate for UTI or relevant urologic/nephrologic pathology.  Objective evaluation as below reviewed   Initial Study  Results:   Laboratory  All laboratory results reviewed without evidence of clinically relevant pathology.   Exceptions include: Hemoglobin is 11.2, when comparing to results from 1 year ago this is greatly improved.  Otherwise lab workup is unremarkable  Reassessment and Plan:   Along with history and physical exam, lab workup is benign and does not point to an acute etiology.  Physical exam does not support diagnosis of acute neurologic condition.  Given findings, will plan for discharge and follow-up with primary care within the following week.  Also will provide patient with hydroxyzine as needed for continued tremors and/or sleep.     Clinical Impression: No diagnosis found.   Data Unavailable   Final Clinical Impression(s) / ED Diagnoses Final diagnoses:  None    Rx / DC Orders ED Discharge Orders     None         Juanetta Nordmann, PA 09/04/23 1016    Nora Beal [place], [place] 09/04/23 1111
